# Patient Record
Sex: Female | Born: 1985
Health system: Southern US, Community
[De-identification: ages and names within clinical notes are randomized; demographics above are authoritative.]

## PROBLEM LIST (undated history)

## (undated) DIAGNOSIS — Z30431 Encounter for routine checking of intrauterine contraceptive device: Principal | ICD-10-CM

## (undated) DIAGNOSIS — Z975 Presence of (intrauterine) contraceptive device: Secondary | ICD-10-CM

## (undated) DIAGNOSIS — I10 Essential (primary) hypertension: Secondary | ICD-10-CM

## (undated) HISTORY — DX: Encounter for routine checking of intrauterine contraceptive device: Z30.431

## (undated) HISTORY — DX: Essential (primary) hypertension: I10

## (undated) HISTORY — DX: Presence of (intrauterine) contraceptive device: Z97.5

## (undated) HISTORY — PX: ABSCESS DRAINAGE: SHX1119

---

## 2005-03-27 ENCOUNTER — Emergency Department (HOSPITAL_COMMUNITY): Admission: EM | Admit: 2005-03-27 | Discharge: 2005-03-27 | Payer: Self-pay | Admitting: Emergency Medicine

## 2010-03-26 ENCOUNTER — Emergency Department (HOSPITAL_COMMUNITY): Admission: EM | Admit: 2010-03-26 | Discharge: 2010-03-26 | Payer: Self-pay | Admitting: Emergency Medicine

## 2013-08-19 ENCOUNTER — Emergency Department (HOSPITAL_COMMUNITY)
Admission: EM | Admit: 2013-08-19 | Discharge: 2013-08-20 | Disposition: A | Discharge status: Home / Self Care | Payer: 59 | Attending: Emergency Medicine | Admitting: Emergency Medicine

## 2013-08-19 ENCOUNTER — Encounter (HOSPITAL_COMMUNITY): Payer: Self-pay | Admitting: Emergency Medicine

## 2013-08-19 DIAGNOSIS — R599 Enlarged lymph nodes, unspecified: Secondary | ICD-10-CM | POA: Insufficient documentation

## 2013-08-19 DIAGNOSIS — J029 Acute pharyngitis, unspecified: Secondary | ICD-10-CM | POA: Insufficient documentation

## 2013-08-19 DIAGNOSIS — R51 Headache: Secondary | ICD-10-CM | POA: Insufficient documentation

## 2013-08-19 DIAGNOSIS — F17200 Nicotine dependence, unspecified, uncomplicated: Secondary | ICD-10-CM | POA: Insufficient documentation

## 2013-08-19 LAB — RAPID STREP SCREEN (MED CTR MEBANE ONLY): Streptococcus, Group A Screen (Direct): NEGATIVE

## 2013-08-19 MED ORDER — PENICILLIN G BENZATHINE 1200000 UNIT/2ML IM SUSP
1.2000 10*6.[IU] | Freq: Once | INTRAMUSCULAR | Status: AC
  Administered 2013-08-20: 1.2 10*6.[IU] via INTRAMUSCULAR
  Filled 2013-08-19: qty 2

## 2013-08-19 NOTE — ED Notes (Signed)
Sore throat and fever for the past couple of days. Took tylenol without relief.

## 2013-08-19 NOTE — ED Provider Notes (Signed)
CSN: [id number]     Arrival date & time 08/19/13  2157 History  This chart was scribed for Sunnie NielsenBrian Dartagnan Beavers, MD by Ardelia Memsylan Malpass, ED Scribe. This patient was seen in room APA19/APA19 and the patient's care was started at 11:45 PM.   Chief Complaint  Patient presents with  . Sore Throat    Patient is a 28 y.o. female presenting with pharyngitis. The history is provided by the patient. No language interpreter was used.  Sore Throat This is a new problem. The current episode started yesterday. The problem occurs rarely. The problem has been gradually worsening. Associated symptoms include headaches. Nothing aggravates the symptoms. Nothing relieves the symptoms. She has tried nothing for the symptoms.    HPI Comments: Jamie Aguilar is a 28 y.o. Female with no chronic medical conditions who presents to the Emergency Department complaining of a sore throat over the past 2 days. She reports associated headache, fever (Tmax 101.8 F) and an enlarged lymph node on the left side of her neck over the past 2 days. She states that she has taken Tylenol without relief of her symptoms. She states that she has a history of Strep throat about 15 years ago, and that her current symptoms feel similar. She states that she has had multiple sick contacts recently, due to working at [employer]. She denies emesis or any other symptoms.   History reviewed. No pertinent past medical history. History reviewed. No pertinent past surgical history. No family history on file. History  Substance Use Topics  . Smoking status: Current Some Day Smoker  . Smokeless tobacco: Not on file  . Alcohol Use: No   OB History   Grav Para Term Preterm Abortions TAB SAB Ect Mult Living                 Review of Systems  Constitutional: Positive for fever.  HENT: Positive for sore throat.   Gastrointestinal: Negative for vomiting.  Neurological: Positive for headaches.  All other systems reviewed and are negative.   Allergies   Review of patient's allergies indicates no known allergies.  Home Medications   Prior to Admission medications   Not on File   Triage Vitals: BP 145/94  Pulse 88  Temp(Src) 99.1 F (37.3 C) (Oral)  Resp 20  Ht 5\' 4"  (1.626 m)  Wt 340 lb (154.223 kg)  BMI 58.33 kg/m2  SpO2 100%  LMP 07/22/2013  Physical Exam  Nursing note and vitals reviewed. Constitutional: She is oriented to person, place, and time. She appears well-developed and well-nourished. No distress.  HENT:  Head: Normocephalic and atraumatic.  Mouth/Throat: Oropharyngeal exudate present.  Enlarged tonsils with exudates  Eyes: EOM are normal.  Neck: Neck supple. No tracheal deviation present.  Tender anterior cervical lymphadenopathy  Cardiovascular: Normal rate.   Pulmonary/Chest: Effort normal. No respiratory distress.  Musculoskeletal: Normal range of motion.  Lymphadenopathy:    She has cervical adenopathy.  Neurological: She is alert and oriented to person, place, and time.  Skin: Skin is warm and dry.  Psychiatric: She has a normal mood and affect. Her behavior is normal.    ED Course  Procedures (including critical care time)  DIAGNOSTIC STUDIES: Oxygen Saturation is 100% on RA, normal by my interpretation.    COORDINATION OF CARE: 11:53 PM- Discussed plan to obtain a Strep screen. Pt advised of plan for treatment and pt agrees.  Labs Review Labs Reviewed  RAPID STREP SCREEN  CULTURE, GROUP A STREP   Motrin provided.  Plan d/c home, WN provided as needed. Return precautions provided and verbalized as understood. Rx motrin.   MDM   Final diagnoses:  Pharyngitis   Bacterial versus viral - has exudates, tender lymph nodes, no cough, clinical strep and given PCN injection.  RN had sent a strep swab with prelim negative and Cx pending. VS and nurses notes reviewed.    I personally performed the services described in this documentation, which was scribed in my presence. The recorded  information has been reviewed and is accurate.    Sunnie NielsenBrian Jaelah Hauth, MD 08/20/13 [phone]

## 2013-08-20 MED ORDER — PENICILLIN G BENZATHINE 1200000 UNIT/2ML IM SUSP
INTRAMUSCULAR | Status: AC
  Filled 2013-08-20: qty 2

## 2013-08-20 MED ORDER — IBUPROFEN 800 MG PO TABS: 800.0000 mg | ORAL_TABLET | Freq: Three times a day (TID) | ORAL | Status: DC

## 2013-08-20 NOTE — Discharge Instructions (Signed)
Pharyngitis °Pharyngitis is redness, pain, and swelling (inflammation) of your pharynx.  °CAUSES  °Pharyngitis is usually caused by infection. Most of the time, these infections are from viruses (viral) and are part of a cold. However, sometimes pharyngitis is caused by bacteria (bacterial). Pharyngitis can also be caused by allergies. Viral pharyngitis may be spread from person to person by coughing, sneezing, and personal items or utensils (cups, forks, spoons, toothbrushes). Bacterial pharyngitis may be spread from person to person by more intimate contact, such as kissing.  °SIGNS AND SYMPTOMS  °Symptoms of pharyngitis include:   °· Sore throat.   °· Tiredness (fatigue).   °· Low-grade fever.   °· Headache. °· Joint pain and muscle aches. °· Skin rashes. °· Swollen lymph nodes. °· Plaque-like film on throat or tonsils (often seen with bacterial pharyngitis). °DIAGNOSIS  °Your health care provider will ask you questions about your illness and your symptoms. Your medical history, along with a physical exam, is often all that is needed to diagnose pharyngitis. Sometimes, a rapid strep test is done. Other lab tests may also be done, depending on the suspected cause.  °TREATMENT  °Viral pharyngitis will usually get better in 3 4 days without the use of medicine. Bacterial pharyngitis is treated with medicines that kill germs (antibiotics).  °HOME CARE INSTRUCTIONS  °· Drink enough water and fluids to keep your urine clear or pale yellow.   °· Only take over-the-counter or prescription medicines as directed by your health care provider:   °· If you are prescribed antibiotics, make sure you finish them even if you start to feel better.   °· Do not take aspirin.   °· Get lots of rest.   °· Gargle with 8 oz of salt water (½ tsp of salt per 1 qt of water) as often as every 1 2 hours to soothe your throat.   °· Throat lozenges (if you are not at risk for choking) or sprays may be used to soothe your throat. °SEEK MEDICAL  CARE IF:  °· You have large, tender lumps in your neck. °· You have a rash. °· You cough up green, yellow-brown, or bloody spit. °SEEK IMMEDIATE MEDICAL CARE IF:  °· Your neck becomes stiff. °· You drool or are unable to swallow liquids. °· You vomit or are unable to keep medicines or liquids down. °· You have severe pain that does not go away with the use of recommended medicines. °· You have trouble breathing (not caused by a stuffy nose). °MAKE SURE YOU:  °· Understand these instructions. °· Will watch your condition. °· Will get help right away if you are not doing well or get worse. °Document Released: 04/21/2005 Document Revised: 02/09/2013 Document Reviewed: 12/27/2012 °ExitCare® Patient Information ©2014 [employer]. ° °

## 2013-08-21 ENCOUNTER — Encounter (HOSPITAL_COMMUNITY): Payer: Self-pay | Admitting: Emergency Medicine

## 2013-08-21 ENCOUNTER — Emergency Department (HOSPITAL_COMMUNITY)
Admission: EM | Admit: 2013-08-21 | Discharge: 2013-08-21 | Disposition: A | Discharge status: Home / Self Care | Payer: 59 | Attending: Emergency Medicine | Admitting: Emergency Medicine

## 2013-08-21 DIAGNOSIS — H9209 Otalgia, unspecified ear: Secondary | ICD-10-CM | POA: Insufficient documentation

## 2013-08-21 DIAGNOSIS — J029 Acute pharyngitis, unspecified: Secondary | ICD-10-CM | POA: Insufficient documentation

## 2013-08-21 DIAGNOSIS — F17200 Nicotine dependence, unspecified, uncomplicated: Secondary | ICD-10-CM | POA: Insufficient documentation

## 2013-08-21 DIAGNOSIS — Z791 Long term (current) use of non-steroidal anti-inflammatories (NSAID): Secondary | ICD-10-CM | POA: Insufficient documentation

## 2013-08-21 LAB — MONONUCLEOSIS SCREEN: MONO SCREEN: NEGATIVE

## 2013-08-21 MED ORDER — IBUPROFEN 100 MG/5ML PO SUSP
600.0000 mg | Freq: Once | ORAL | Status: AC
  Administered 2013-08-21: 600 mg via ORAL
  Filled 2013-08-21: qty 30

## 2013-08-21 MED ORDER — LIDOCAINE VISCOUS 2 % MT SOLN
15.0000 mL | Freq: Once | OROMUCOSAL | Status: AC
  Administered 2013-08-21: 15 mL via OROMUCOSAL
  Filled 2013-08-21: qty 15

## 2013-08-21 MED ORDER — MAGIC MOUTHWASH W/LIDOCAINE
10.0000 mL | Freq: Four times a day (QID) | ORAL | Status: DC | PRN
Start: 2013-08-21 — End: 2013-09-02

## 2013-08-21 NOTE — ED Provider Notes (Signed)
Medical screening examination/treatment/procedure(s) were performed by non-physician practitioner and as supervising physician I was immediately available for consultation/collaboration.  Flint MelterElliott L Vernestine Brodhead, MD 08/21/13 [phone]
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
## 2013-08-21 NOTE — Discharge Instructions (Signed)
Pharyngitis Pharyngitis is redness, pain, and swelling (inflammation) of your pharynx.  CAUSES  Pharyngitis is usually caused by infection. Most of the time, these infections are from viruses (viral) and are part of a cold. However, sometimes pharyngitis is caused by bacteria (bacterial). Pharyngitis can also be caused by allergies. Viral pharyngitis may be spread from person to person by coughing, sneezing, and personal items or utensils (cups, forks, spoons, toothbrushes). Bacterial pharyngitis may be spread from person to person by more intimate contact, such as kissing.  SIGNS AND SYMPTOMS  Symptoms of pharyngitis include:   Sore throat.   Tiredness (fatigue).   Low-grade fever.   Headache.  Joint pain and muscle aches.  Skin rashes.  Swollen lymph nodes.  Plaque-like film on throat or tonsils (often seen with bacterial pharyngitis). DIAGNOSIS  Your health care provider will ask you questions about your illness and your symptoms. Your medical history, along with a physical exam, is often all that is needed to diagnose pharyngitis. Sometimes, a rapid strep test is done. Other lab tests may also be done, depending on the suspected cause.  TREATMENT  Viral pharyngitis will usually get better in 3 4 days without the use of medicine. Bacterial pharyngitis is treated with medicines that kill germs (antibiotics).  HOME CARE INSTRUCTIONS   Drink enough water and fluids to keep your urine clear or pale yellow.   Only take over-the-counter or prescription medicines as directed by your health care provider:   If you are prescribed antibiotics, make sure you finish them even if you start to feel better.   Do not take aspirin.   Get lots of rest.   Gargle with 8 oz of salt water ( tsp of salt per 1 qt of water) as often as every 1 2 hours to soothe your throat.   Throat lozenges (if you are not at risk for choking) or sprays may be used to soothe your throat. SEEK MEDICAL  CARE IF:   You have large, tender lumps in your neck.  You have a rash.  You cough up green, yellow-brown, or bloody spit. SEEK IMMEDIATE MEDICAL CARE IF:   Your neck becomes stiff.  You drool or are unable to swallow liquids.  You vomit or are unable to keep medicines or liquids down.  You have severe pain that does not go away with the use of recommended medicines.  You have trouble breathing (not caused by a stuffy nose). MAKE SURE YOU:   Understand these instructions.  Will watch your condition.  Will get help right away if you are not doing well or get worse. Document Released: 04/21/2005 Document Revised: 02/09/2013 Document Reviewed: 12/27/2012 [facility] Patient Information 2014 [place], [place].   Your lab test for mono is negative today.  Your strep culture is pending (from your visit on Friday).  Rest,  Use ibuprofen for throat pain relief and fever,  Magic mouthwash can also help with topical pain relief.  Rest,  Make sure you are drinking plenty of fluids.

## 2013-08-21 NOTE — ED Notes (Addendum)
Pt co sore throat with swelling and white patches since Thursday. Pt states she cannot swallow without pain. Pt treated for strep throat on Fri, given penicillin injection, condition worse since then.

## 2013-08-21 NOTE — ED Notes (Signed)
Pt alert & oriented x4, stable gait. Patient given discharge instructions, paperwork & prescription(s). Patient  instructed to stop at the registration desk to finish any additional paperwork. Patient verbalized understanding. Pt left department w/ no further questions. 

## 2013-08-22 LAB — CULTURE, GROUP A STREP

## 2013-08-24 ENCOUNTER — Emergency Department (HOSPITAL_COMMUNITY)
Admission: EM | Admit: 2013-08-24 | Discharge: 2013-08-24 | Disposition: A | Discharge status: Home / Self Care | Payer: 59 | Source: Home / Self Care | Attending: Emergency Medicine | Admitting: Emergency Medicine

## 2013-08-24 ENCOUNTER — Encounter (HOSPITAL_COMMUNITY): Payer: Self-pay | Admitting: Emergency Medicine

## 2013-08-24 DIAGNOSIS — J039 Acute tonsillitis, unspecified: Secondary | ICD-10-CM

## 2013-08-24 LAB — POCT I-STAT, CHEM 8
BUN: 11 mg/dL (ref 6–23)
CHLORIDE: 105 meq/L (ref 96–112)
CREATININE: 0.8 mg/dL (ref 0.50–1.10)
Calcium, Ion: 1.18 mmol/L (ref 1.12–1.23)
Glucose, Bld: 111 mg/dL — ABNORMAL HIGH (ref 70–99)
HCT: 47 % — ABNORMAL HIGH (ref 36.0–46.0)
Hemoglobin: 16 g/dL — ABNORMAL HIGH (ref 12.0–15.0)
Potassium: 4.1 mEq/L (ref 3.7–5.3)
SODIUM: 143 meq/L (ref 137–147)
TCO2: 23 mmol/L (ref 0–100)

## 2013-08-24 MED ORDER — ONDANSETRON 4 MG PO TBDP
ORAL_TABLET | ORAL | Status: AC
  Filled 2013-08-24: qty 2

## 2013-08-24 MED ORDER — ONDANSETRON 8 MG PO TBDP: 8.0000 mg | ORAL_TABLET | Freq: Three times a day (TID) | ORAL | Status: DC | PRN

## 2013-08-24 MED ORDER — DEXAMETHASONE SODIUM PHOSPHATE 10 MG/ML IJ SOLN
INTRAMUSCULAR | Status: AC
  Filled 2013-08-24: qty 1

## 2013-08-24 MED ORDER — PREDNISONE 20 MG PO TABS: 20.0000 mg | ORAL_TABLET | Freq: Two times a day (BID) | ORAL | Status: DC

## 2013-08-24 MED ORDER — CLINDAMYCIN HCL 300 MG PO CAPS: 300.0000 mg | ORAL_CAPSULE | Freq: Four times a day (QID) | ORAL | Status: DC

## 2013-08-24 MED ORDER — ONDANSETRON 4 MG PO TBDP
8.0000 mg | ORAL_TABLET | Freq: Once | ORAL | Status: AC
  Administered 2013-08-24: 8 mg via ORAL

## 2013-08-24 MED ORDER — OXYCODONE-ACETAMINOPHEN 5-325 MG PO TABS: ORAL_TABLET | ORAL | Status: DC

## 2013-08-24 MED ORDER — DEXAMETHASONE SODIUM PHOSPHATE 10 MG/ML IJ SOLN
10.0000 mg | Freq: Once | INTRAMUSCULAR | Status: AC
  Administered 2013-08-24: 10 mg via INTRAMUSCULAR
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
## 2013-08-24 NOTE — Discharge Instructions (Signed)

## 2013-08-24 NOTE — ED Notes (Signed)
Recently dx with strep on Friday.  Pt presents to day with vomiting since waking this a.m.    Also having fever,  Weakness,  And no appetites.  States 10lb weight loss.

## 2013-08-26 ENCOUNTER — Emergency Department (INDEPENDENT_AMBULATORY_CARE_PROVIDER_SITE_OTHER)
Admission: EM | Admit: 2013-08-26 | Discharge: 2013-08-26 | Disposition: A | Discharge status: Home / Self Care | Payer: 59 | Source: Home / Self Care | Attending: Family Medicine | Admitting: Family Medicine

## 2013-08-26 ENCOUNTER — Encounter (HOSPITAL_COMMUNITY): Payer: Self-pay | Admitting: Emergency Medicine

## 2013-08-26 DIAGNOSIS — J039 Acute tonsillitis, unspecified: Secondary | ICD-10-CM

## 2013-08-26 MED ORDER — OXYCODONE-ACETAMINOPHEN 5-325 MG PO TABS: ORAL_TABLET | ORAL | Status: DC

## 2013-08-26 MED ORDER — CEFTRIAXONE SODIUM 1 G IJ SOLR
INTRAMUSCULAR | Status: AC
  Filled 2013-08-26: qty 10

## 2013-08-26 MED ORDER — CEFTRIAXONE SODIUM 250 MG IJ SOLR
1000.0000 mg | Freq: Once | INTRAMUSCULAR | Status: AC
  Administered 2013-08-26: 1000 mg via INTRAMUSCULAR

## 2013-08-26 MED ORDER — LIDOCAINE HCL (PF) 1 % IJ SOLN
INTRAMUSCULAR | Status: AC
  Filled 2013-08-26: qty 5

## 2013-08-26 NOTE — Discharge Instructions (Signed)
Thank you for coming in today. [facility]  Tonsillitis Tonsillitis is an infection of the throat that causes the tonsils to become red, tender, and swollen. Tonsils are collections of lymphoid tissue at the back of the throat. Each tonsil has crevices (crypts). Tonsils help fight nose and throat infections and keep infection from spreading to other parts of the body for the first 18 months of life.  CAUSES Sudden (acute) tonsillitis is usually caused by infection with streptococcal bacteria. Long-lasting (chronic) tonsillitis occurs when the crypts of the tonsils become filled with pieces of food and bacteria, which makes it easy for the tonsils to become repeatedly infected. SYMPTOMS  Symptoms of tonsillitis include:  A sore throat, with possible difficulty swallowing.  White patches on the tonsils.  Fever.  Tiredness.  New episodes of snoring during sleep, when you did not snore before.  Small, foul-smelling, yellowish-white pieces of material (tonsilloliths) that you occasionally cough up or spit out. The tonsilloliths can also cause you to have bad breath. DIAGNOSIS Tonsillitis can be diagnosed through a physical exam. Diagnosis can be confirmed with the results of lab tests, including a throat culture. TREATMENT  The goals of tonsillitis treatment include the reduction of the severity and duration of symptoms and prevention of associated conditions. Symptoms of tonsillitis can be improved with the use of steroids to reduce the swelling. Tonsillitis caused by bacteria can be treated with antibiotics. Usually, treatment with antibiotics is started before the cause of the tonsillitis is known. However, if it is determined that the cause is not bacterial, antibiotics will not treat the tonsillitis. If attacks of tonsillitis are severe and frequent, your caregiver may recommend surgery to remove the tonsils (tonsillectomy). HOME CARE INSTRUCTIONS     Rest as much as possible and get plenty of sleep.  Drink plenty of fluids. While the throat is very sore, eat soft foods or liquids, such as sherbet, soups, or instant breakfast drinks.  Eat frozen ice pops.  Gargle with a warm or cold liquid to help soothe the throat. Mix 1/4 teaspoon of salt and 1/4 teaspoon of baking soda in in 8 oz of water. SEEK MEDICAL CARE IF:   Large, tender lumps develop in your neck.  A rash develops.  A green, yellow-brown, or bloody substance is coughed up.  You are unable to swallow liquids or food for 24 hours.  You notice that only one of the tonsils is swollen. SEEK IMMEDIATE MEDICAL CARE IF:   You develop any new symptoms such as vomiting, severe headache, stiff neck, chest pain, or trouble breathing or swallowing.  You have severe throat pain along with drooling or voice changes.  You have severe pain, unrelieved with recommended medications.  You are unable to fully open the mouth.  You develop redness, swelling, or severe pain anywhere in the neck.  You have a fever. MAKE SURE YOU:   Understand these instructions.  Will watch your condition.  Will get help right away if you are not doing well or get worse. Document Released: 01/29/2005 Document Revised: 12/22/2012 Document Reviewed: 10/08/2012 [facility] Patient Information 2014 [place]Care, [place]LL[place].

## 2013-08-26 NOTE — ED Provider Notes (Signed)
Jamie Aguilar is a 28 y.o. female who presents to Urgent Care today for tonsil pain. Patient was seen 2 days prior for tonsillitis. She failed treatment with penicillin. She was started on prednisone, clindamycin, and oxycodone. She has moderate improvement in pain. She still has pain with swallowing. She denies any fevers chills nausea vomiting or diarrhea. She is here today for reevaluation.   History reviewed. No pertinent past medical history. History  Substance Use Topics  . Smoking status: Current Some Day Smoker  . Smokeless tobacco: Not on file  . Alcohol Use: Yes   ROS as above Medications: Current Facility-Administered Medications  Medication Dose Route Frequency Provider Last Rate Last Dose  . cefTRIAXone (ROCEPHIN) injection 1,000 mg  1,000 mg Intramuscular Once Rodolph BongEvan S Ridley Schewe, MD       Current Outpatient Prescriptions  Medication Sig Dispense Refill  . Alum & Mag Hydroxide-Simeth (MAGIC MOUTHWASH W/LIDOCAINE) SOLN Take 10 mLs by mouth 4 (four) times daily as needed (throat pain).  120 mL  0  . clindamycin (CLEOCIN) 300 MG capsule Take 1 capsule (300 mg total) by mouth 4 (four) times daily.  40 capsule  0  . ibuprofen (ADVIL,MOTRIN) 800 MG tablet Take 1 tablet (800 mg total) by mouth 3 (three) times daily.  21 tablet  0  . ondansetron (ZOFRAN ODT) 8 MG disintegrating tablet Take 1 tablet (8 mg total) by mouth every 8 (eight) hours as needed for nausea.  20 tablet  0  . oxyCODONE-acetaminophen (PERCOCET) 5-325 MG per tablet 1 to 2 tablets every 6 hours as needed for pain.  20 tablet  0  . predniSONE (DELTASONE) 20 MG tablet Take 1 tablet (20 mg total) by mouth 2 (two) times daily.  10 tablet  0    Exam:  BP 146/102  Pulse 95  Temp(Src) 98 F (36.7 C) (Oral)  Resp 18  Ht 5\' 4"  (1.626 m)  Wt 333 lb (151.048 kg)  BMI 57.13 kg/m2  SpO2 98%  LMP 07/24/2013 Gen: Well NAD HEENT: EOMI,  MMM significant tonsillar erythema and exudate bilaterally. The left tonsil is somewhat  enlarged. No midline deviation. Lungs: Normal work of breathing. CTABL Heart: RRR no MRG Abd: NABS, Soft. NT, ND Exts: Brisk capillary refill, warm and well perfused.   No results found for this or any previous visit (from the past 24 hour(s)). No results found.  Assessment and Plan: 28 y.o. female with tonsillitis. Doubtful for peritonsillar abscess. Plan to administer ceftriaxone injection. Continue clindamycin, prednisone, and oxycodone. [facility]  Discussed warning signs or symptoms. Please see discharge instructions. Patient expresses understanding.   Rodolph BongEvan S Joud Pettinato, MD 08/26/13 1154

## 2013-09-02 ENCOUNTER — Emergency Department (HOSPITAL_COMMUNITY): Payer: 59

## 2013-09-02 ENCOUNTER — Encounter (HOSPITAL_COMMUNITY): Payer: Self-pay | Admitting: Emergency Medicine

## 2013-09-02 ENCOUNTER — Emergency Department (HOSPITAL_COMMUNITY)
Admission: EM | Admit: 2013-09-02 | Discharge: 2013-09-02 | Disposition: A | Discharge status: Home / Self Care | Payer: 59 | Attending: Emergency Medicine | Admitting: Emergency Medicine

## 2013-09-02 DIAGNOSIS — F17200 Nicotine dependence, unspecified, uncomplicated: Secondary | ICD-10-CM | POA: Insufficient documentation

## 2013-09-02 DIAGNOSIS — R5381 Other malaise: Secondary | ICD-10-CM | POA: Insufficient documentation

## 2013-09-02 DIAGNOSIS — Z792 Long term (current) use of antibiotics: Secondary | ICD-10-CM | POA: Insufficient documentation

## 2013-09-02 DIAGNOSIS — R112 Nausea with vomiting, unspecified: Secondary | ICD-10-CM | POA: Insufficient documentation

## 2013-09-02 DIAGNOSIS — Z8619 Personal history of other infectious and parasitic diseases: Secondary | ICD-10-CM | POA: Insufficient documentation

## 2013-09-02 DIAGNOSIS — Z79899 Other long term (current) drug therapy: Secondary | ICD-10-CM | POA: Insufficient documentation

## 2013-09-02 DIAGNOSIS — IMO0002 Reserved for concepts with insufficient information to code with codable children: Secondary | ICD-10-CM | POA: Insufficient documentation

## 2013-09-02 DIAGNOSIS — R1013 Epigastric pain: Secondary | ICD-10-CM | POA: Insufficient documentation

## 2013-09-02 DIAGNOSIS — R1032 Left lower quadrant pain: Secondary | ICD-10-CM | POA: Insufficient documentation

## 2013-09-02 DIAGNOSIS — Z3202 Encounter for pregnancy test, result negative: Secondary | ICD-10-CM | POA: Insufficient documentation

## 2013-09-02 DIAGNOSIS — R109 Unspecified abdominal pain: Secondary | ICD-10-CM

## 2013-09-02 DIAGNOSIS — R5383 Other fatigue: Secondary | ICD-10-CM

## 2013-09-02 LAB — CBC WITH DIFFERENTIAL/PLATELET
Basophils Absolute: 0 10*3/uL (ref 0.0–0.1)
Basophils Relative: 0 % (ref 0–1)
Eosinophils Absolute: 0.1 10*3/uL (ref 0.0–0.7)
Eosinophils Relative: 1 % (ref 0–5)
HCT: 38.9 % (ref 36.0–46.0)
HEMOGLOBIN: 13 g/dL (ref 12.0–15.0)
LYMPHS ABS: 2 10*3/uL (ref 0.7–4.0)
Lymphocytes Relative: 23 % (ref 12–46)
MCH: 30 pg (ref 26.0–34.0)
MCHC: 33.4 g/dL (ref 30.0–36.0)
MCV: 89.6 fL (ref 78.0–100.0)
MONOS PCT: 8 % (ref 3–12)
Monocytes Absolute: 0.7 10*3/uL (ref 0.1–1.0)
NEUTROS ABS: 6 10*3/uL (ref 1.7–7.7)
Neutrophils Relative %: 68 % (ref 43–77)
Platelets: 297 10*3/uL (ref 150–400)
RBC: 4.34 MIL/uL (ref 3.87–5.11)
RDW: 12.6 % (ref 11.5–15.5)
WBC: 8.7 10*3/uL (ref 4.0–10.5)

## 2013-09-02 LAB — URINALYSIS, ROUTINE W REFLEX MICROSCOPIC
GLUCOSE, UA: NEGATIVE mg/dL
Ketones, ur: 15 mg/dL — AB
LEUKOCYTES UA: NEGATIVE
Nitrite: NEGATIVE
Specific Gravity, Urine: 1.015 (ref 1.005–1.030)
Urobilinogen, UA: 0.2 mg/dL (ref 0.0–1.0)
pH: 8 (ref 5.0–8.0)

## 2013-09-02 LAB — COMPREHENSIVE METABOLIC PANEL
ALBUMIN: 3.7 g/dL (ref 3.5–5.2)
ALT: 24 U/L (ref 0–35)
AST: 22 U/L (ref 0–37)
Alkaline Phosphatase: 82 U/L (ref 39–117)
BILIRUBIN TOTAL: 0.6 mg/dL (ref 0.3–1.2)
BUN: 12 mg/dL (ref 6–23)
CHLORIDE: 104 meq/L (ref 96–112)
CO2: 24 mEq/L (ref 19–32)
Calcium: 9.1 mg/dL (ref 8.4–10.5)
Creatinine, Ser: 0.87 mg/dL (ref 0.50–1.10)
GFR calc non Af Amer: 90 mL/min — ABNORMAL LOW (ref >90)
GLUCOSE: 107 mg/dL — AB (ref 70–99)
POTASSIUM: 3.8 meq/L (ref 3.7–5.3)
Sodium: 141 mEq/L (ref 137–147)
Total Protein: 7.6 g/dL (ref 6.0–8.3)

## 2013-09-02 LAB — LIPASE, BLOOD: LIPASE: 18 U/L (ref 11–59)

## 2013-09-02 LAB — PREGNANCY, URINE: PREG TEST UR: NEGATIVE

## 2013-09-02 LAB — URINE MICROSCOPIC-ADD ON

## 2013-09-02 MED ORDER — GI COCKTAIL ~~LOC~~
30.0000 mL | Freq: Once | ORAL | Status: AC
  Administered 2013-09-02: 30 mL via ORAL
  Filled 2013-09-02: qty 30

## 2013-09-02 MED ORDER — SODIUM CHLORIDE 0.9 % IV SOLN
INTRAVENOUS | Status: DC
  Administered 2013-09-02: 15:00:00 via INTRAVENOUS

## 2013-09-02 MED ORDER — ONDANSETRON HCL 4 MG PO TABS: 4.0000 mg | ORAL_TABLET | Freq: Three times a day (TID) | ORAL | Status: DC | PRN

## 2013-09-02 MED ORDER — SODIUM CHLORIDE 0.9 % IV BOLUS (SEPSIS)
500.0000 mL | Freq: Once | INTRAVENOUS | Status: AC
  Administered 2013-09-02: 500 mL via INTRAVENOUS

## 2013-09-02 MED ORDER — ONDANSETRON HCL 4 MG/2ML IJ SOLN
INTRAMUSCULAR | Status: AC
  Filled 2013-09-02: qty 2

## 2013-09-02 MED ORDER — FAMOTIDINE IN NACL 20-0.9 MG/50ML-% IV SOLN
20.0000 mg | Freq: Once | INTRAVENOUS | Status: AC
  Administered 2013-09-02: 20 mg via INTRAVENOUS
  Filled 2013-09-02: qty 50

## 2013-09-02 MED ORDER — ONDANSETRON HCL 4 MG/2ML IJ SOLN
4.0000 mg | INTRAMUSCULAR | Status: AC | PRN
  Administered 2013-09-02 (×2): 4 mg via INTRAVENOUS
  Filled 2013-09-02: qty 2

## 2013-09-02 NOTE — Discharge Instructions (Signed)
°Emergency Department Resource Guide °1) Find a Doctor and Pay Out of Pocket °Although you won't have to find out who is covered by your insurance plan, it is a good idea to ask around and get recommendations. You will then need to call the office and see if the doctor you have chosen will accept you as a new patient and what types of options they offer for patients who are self-pay. Some doctors offer discounts or will set up payment plans for their patients who do not have insurance, but you will need to ask so you aren't surprised when you get to your appointment. ° °2) Contact Your Local Health Department °Not all health departments have doctors that can see patients for sick visits, but many do, so it is worth a call to see if yours does. If you don't know where your local health department is, you can check in your phone book. The CDC also has a tool to help you locate your state's health department, and many state websites also have listings of all of their local health departments. ° °3) Find a Walk-in Clinic °If your illness is not likely to be very severe or complicated, you may want to try a walk in clinic. These are popping up all over the country in pharmacies, drugstores, and shopping centers. They're usually staffed by nurse practitioners or physician assistants that have been trained to treat common illnesses and complaints. They're usually fairly quick and inexpensive. However, if you have serious medical issues or chronic medical problems, these are probably not your best option. ° °No Primary Care Doctor: °- Call [facility] Connect at  832-8000 - they can help you locate a primary care doctor that  accepts your insurance, provides certain services, etc. °- Physician Referral Service- 1-800-533-3463 ° °Chronic Pain Problems: °Organization         Address  Phone   Notes  °[facility]  (336) [phone] Patients need to be referred by their primary care doctor.  ° °Medication  Assistance: °Organization         Address  Phone   Notes  °[place] [facility] [address] °[place], [place] [address] (336) 641-8030 --Must be a resident of [place] °-- Must have NO insurance coverage whatsoever (no Medicaid/ Medicare, etc.) °-- The pt. MUST have a primary care doctor that directs their care regularly and follows them in the community °  °[facility]  (866) 331-1348   °[employer]  (888) 892-1162   ° °Agencies that provide inexpensive medical care: °Organization         Address  Phone   Notes  °[facility]  (336) 832-8035   °[facility]    (336) 832-7272   °[facility] [address] °[place], [place] [address] (336) 832-4777   °[facility] [address], °[place] (336) 271-4999   °[facility]    (336) 373-0678   °[facility]    (336) 272-1050   °[facility] ° [address], [place] Phone:  (336) 832-4444, Fax:  (336) 832-4440 Hours of Operation:  9 am - 6 pm, M-F.  Also accepts Medicaid/Medicare and self-pay.  °[facility] ° [address], [place] Phone: (336) [phone], Fax: (336) [phone]. Hours of Operation:  8:30 am - 5:30 pm, M-F.  Also accepts Medicaid and self-pay.  °[facility] [place] [address], [place] Phone: (336) 878-6027   °[facility] [address], [place], [place] (336)723-1848, Ext. 123 Mondays & Thursdays: 7-9 AM.  First 15 patients are seen on a first come, first serve basis. °  ° °Medicaid-accepting [place] Providers: ° °Organization         Address  Phone   Notes  °[facility] [address], [place] (336) 641-2100 Also accepts self-pay patients.  °[facility] [address], [place] ° (336) 856-9996   °[facility] [address], [place]  (336) 288-8857   °[facility] [address] [place] [address], [place] (336) 299-7000   °Veita Bland [address], [place]  ° (336) 373-1557 Only accepts [place] Access Medicaid patients after they have their name applied to their card.  ° °Self-Pay (no insurance) in [place]: ° °Organization         Address  Phone   Notes  °Sickle Cell Patients, [facility] [address], [place] (336) 832-1970   °[facility] [address], [place] (336) 832-4400   °[facility] ° [address], [place] (336) 992-4800   °[facility]/Dr. Osei-Bonsu ° [address] [place] [address], [place] or [address], [place] (336) 841-8500 Phone number for both [place] and [place] locations is the same.  °[facility] [address], [place] (336) 299-0000   °[facility] [address] [place] [address], [place] or [address] (336) 852-7530 °(336) 878-2260   °[facility] [address], [place] (336) 350-1642, phone; (336) 294-5005, fax Sees patients 1st and 3rd Saturday of every month.  Must not qualify for public or private insurance (i.e. Medicaid, Medicare, [place] Health Choice, Veterans' Benefits) • Household income should be no more than 200% of the poverty level •The clinic cannot treat you if you are pregnant or think you are pregnant • Sexually transmitted diseases are not treated at the clinic.  ° ° °Dental Care: °Organization         Address  Phone  Notes  °[place] [facility] [address], [place] (336) 641-6152 Accepts children up to age 21 who are enrolled in Medicaid or [place] Health Choice; pregnant women with a Medicaid card; and children who have applied for Medicaid or [place] Health Choice, but were declined, whose parents can pay a reduced fee at time of service.  °[place]  [facility] [place]  [address], [place] (336) 641-7733 Accepts children up to age 21 who are enrolled in Medicaid or [place] Health Choice; pregnant women with a Medicaid card; and children who have applied for Medicaid or [place] Health Choice, but were declined, whose parents can pay a reduced fee at time of service.  °[facility] ° [address], [place] (336) 641-4533 Patients are seen by appointment only. Walk-ins are not accepted. [facility] will see patients 18 years of age and older. °Monday - Tuesday (8am-5pm) °Most Wednesdays (8:30-5pm) °$30 per visit, cash only  °[facility] ° [address], [place] (336) 641-4533 Patients are seen by appointment only. Walk-ins are not accepted. [facility] will see patients 18 years of age and older. °One   Wednesday Evening (Monthly: Volunteer Based).  $30 per visit, cash only  °[facility]  (919) 537-3737 for adults; Children under age 4, call Graduate Pediatric Dentistry at ([phone]) [phone]. Children aged 4-14, please call ([phone]) [phone] to request a pediatric application. ° Dental services are provided in all areas of dental care including fillings, crowns and bridges, complete and partial dentures, implants, gum treatment, root canals, and extractions. Preventive care is also provided. Treatment is provided to both adults and children. °Patients are selected via a lottery and there is often a waiting list. °  °[facility] [address], °[place] ° (336) 763-8833 [email or web address] °  °[facility] [address], [place], [place] (336)723-1848, Ext. 123 Second and Fourth Thursday of each month, opens at 6:30 AM; Clinic ends at 9 AM.  Patients are seen on a first-come first-served basis, and a limited number are seen during each clinic.  ° °[facility] ° [address], [place], [place] (336) 723-7904    Eligibility Requirements °You must have lived in [place], [place], or [place] counties for at least the last three months. °  You cannot be eligible for state or federal sponsored healthcare insurance, including Veterans Administration, Medicaid, or Medicare. °  You generally cannot be eligible for healthcare insurance through your employer.  °  How to apply: °Eligibility screenings are held every Tuesday and Wednesday afternoon from 1:00 pm until 4:00 pm. You do not need an appointment for the interview!  °[facility] [address], [place], [place] 336-631-2330   °[facility]  336-342-8273   °[facility]  336-703-3100   °[facility]  336-570-6415   ° °Behavioral Health Resources in the Community: °Intensive Outpatient Programs °Organization         Address  Phone  Notes  °[facility] [address], [place], [place] 336-878-6098   °[facility] [address], [place], [place] 336-832-9800   °[facility] [address], [place], [place] ° 336-882-2125   °[facility] [address],  °[place], [place] 1-800-853-5163 or 336-641-4981   °Substance Abuse Resources °Organization         Address  Phone  Notes  °[facility]  336-882-2125   °[facility]  336-784-9470   °[facility]  336-285-9073   °[facility]  336-845-3988   °Residential & Outpatient Substance Abuse Program  1-800-659-3381   °Psychological Services °Organization         Address  Phone  Notes  °[facility]  336- 832-9600   °[facility]  336- 378-7881   °[facility] [address], [place] 1-800-853-5163 or 336-641-4981   ° °Mobile Crisis Teams °Organization         Address  Phone  Notes  °[facility], Mobile Crisis Care Unit  1-877-626-1772   °Assertive °[facility] ° [address]  [place], [place] 336-834-9664   °Sharon DeEsch [address] °[place] 336-554-5454   ° °Self-Help/Support Groups °Organization         Address  Phone             Notes  °[facility] of  - variety of support groups  336- 373-1402 Call for more information  °Narcotics Anonymous (NA), [facility] [address], °[place]  2 meetings at this location  ° °  Residential Treatment Programs Organization         Address  Phone  Notes  [facility] [address],    [place]  [phone]   [facility]  [address], [place] [phone], [place], [place] [phone]   [facility] [address], [place] [phone] Admissions: 8am-3pm M-F  [facility] [address]    [place], [place] [phone]   [facility] [address], [place], [place] [phone]   [facility] [address]  [place], [place] [phone](912) [phone]   [facility] Intensive Outpatient [address], [place], [place] [phone]   [facility] ([facility] [address]  [place], [place] [phone] or (351) 784-3500([phone]) [phone]   [facility] ([facility] [address] [place], [place] [phone] Accepts Medicaid  [facility] [address]  [place] [phone]) [phone] Substance Abuse/Addiction Treatment   [facility]         Address  Phone  Notes  [facility]  478-054-2282(888) (684) [phone]   Angie FavaJulie Brannon, PhD [address], [address] [place], [place]   (639)[phone](336) [phone] or [phone](336) [phone]   [facility]   [address] [place], [place] [phone](336) [phone]   [facility] [address], [place], [place] [phone](336) [phone] Insurance/Medicaid/sponsorship through [facility]  [facility] [address]                                    [place], [place] [phone](336) [phone] Therapy/tele-psych/case    [facility] [address]   [place], [place] [phone](336) [phone]    Dr. Lolly MustacheArfeen  [phone](336) [phone]   [facility]  [employer] [facility] 1) [address], [place] 2) [address], [place] 3)  [address], [place] [phone](336) [phone](336) [phone]  [phone](336) [phone]   [facility] Child Abuse Hotline (419)078-1716(336) 805-555-9215 or 412-132-7679(336) (438)[phone] (After Hours)       Eat a bland diet, avoiding greasy, fatty, fried foods, as well as spicy and acidic foods or beverages.  Avoid eating within the hour or 2 before going to bed or laying down.  Also avoid teas, colas, coffee, chocolate, pepermint and spearment.  Take over the counter pepcid, one tablet by mouth twice a day, for the next 2 to 3 weeks.  May also take over the counter maalox/mylanta, as directed on packaging, as needed for discomfort.  Take the prescription as directed.  Call your regular medical doctor and the GI doctor on Monday to schedule a follow up appointment within the next week.  Return to the Emergency Department immediately if worsening.

## 2013-09-02 NOTE — ED Provider Notes (Signed)
CSN: [id number]     Arrival date & time 09/02/13  1330 History   First MD Initiated Contact with Patient 09/02/13 1357     Chief Complaint  Patient presents with  . Emesis      HPI Pt was seen at 1400. Per pt, c/o gradual onset and persistence of multiple intermittent episodes of N/V that began 2 weeks ago. Has been associated with upper abd pain which radiates into her chest and back. States her symptoms began "after I got sick with a sore throat that was a virus" 2 weeks ago. Has been associated with generalized weakness/fatigue. Denies CP/SOB, no flank pain, no fevers, no black or blood in stools or emesis.     History reviewed. No pertinent past medical history.  Past Surgical History  Procedure Laterality Date  . Cesarean section      History  Substance Use Topics  . Smoking status: Current Some Day Smoker  . Smokeless tobacco: Not on file  . Alcohol Use: Yes    Review of Systems ROS: Statement: All systems negative except as marked or noted in the HPI; Constitutional: Negative for fever and chills. +generalized weakness/fatigue. ; ; Eyes: Negative for eye pain, redness and discharge. ; ; ENMT: Negative for ear pain, hoarseness, nasal congestion, sinus pressure and sore throat. ; ; Cardiovascular: Negative for palpitations, diaphoresis, dyspnea and peripheral edema. ; ; Respiratory: Negative for cough, wheezing and stridor. ; ; Gastrointestinal: +N/V, abd pain. Negative for diarrhea, blood in stool, hematemesis, jaundice and rectal bleeding. . ; ; Genitourinary: Negative for dysuria, flank pain and hematuria. ; ; Musculoskeletal: Negative for back pain and neck pain. Negative for swelling and trauma.; ; Skin: Negative for pruritus, rash, abrasions, blisters, bruising and skin lesion.; ; Neuro: Negative for headache, lightheadedness and neck stiffness. Negative for altered level of consciousness , altered mental status, extremity weakness, paresthesias, involuntary movement, seizure  and syncope.        Allergies  Review of patient's allergies indicates no known allergies.  Home Medications   Prior to Admission medications   Medication Sig Start Date End Date Taking? Authorizing Provider  ondansetron (ZOFRAN ODT) 8 MG disintegrating tablet Take 1 tablet (8 mg total) by mouth every 8 (eight) hours as needed for nausea. 08/24/13  Yes Reuben Likesavid C Keller, MD  oxyCODONE-acetaminophen (PERCOCET) 5-325 MG per tablet 1 to 2 tablets every 6 hours as needed for pain. 08/26/13  Yes Rodolph BongEvan S Corey, MD  clindamycin (CLEOCIN) 300 MG capsule Take 1 capsule (300 mg total) by mouth 4 (four) times daily. 08/24/13   Reuben Likesavid C Keller, MD  predniSONE (DELTASONE) 20 MG tablet Take 1 tablet (20 mg total) by mouth 2 (two) times daily. 08/24/13   Reuben Likesavid C Keller, MD   BP 120/78  Pulse 78  Temp(Src) 97.8 F (36.6 C) (Oral)  Resp 18  SpO2 100%  LMP 09/02/2013 Physical Exam 1405: Physical examination:  Nursing notes reviewed; Vital signs and O2 SAT reviewed;  Constitutional: Well developed, Well nourished, Well hydrated, In no acute distress; Head:  Normocephalic, atraumatic; Eyes: EOMI, PERRL, No scleral icterus; ENMT: Mouth and pharynx normal, Mucous membranes moist; Neck: Supple, Full range of motion, No lymphadenopathy; Cardiovascular: Regular rate and rhythm, No murmur, rub, or gallop; Respiratory: Breath sounds clear & equal bilaterally, No rales, rhonchi, wheezes.  Speaking full sentences with ease, Normal respiratory effort/excursion; Chest: Nontender, Movement normal; Abdomen: Soft, +mid-epigastric and LUQ tenderness to palp. No rebound or guarding. Nondistended, Normal bowel sounds; Genitourinary: No CVA  tenderness; Extremities: Pulses normal, No tenderness, No edema, No calf edema or asymmetry.; Neuro: AA&Ox3, Major CN grossly intact.  Speech clear. No gross focal motor or sensory deficits in extremities. Climbs on and off stretcher easily by herself. Gait steady.; Skin: Color normal, Warm,  Dry.   ED Course  Procedures     EKG Interpretation None      MDM  MDM Reviewed: previous chart, nursing note and vitals Reviewed previous: labs Interpretation: labs, ultrasound and x-ray    Results for orders placed during the hospital encounter of 09/02/13  URINALYSIS, ROUTINE W REFLEX MICROSCOPIC      Result Value Ref Range   Color, Urine YELLOW  YELLOW   APPearance CLEAR  CLEAR   Specific Gravity, Urine 1.015  1.005 - 1.030   pH 8.0  5.0 - 8.0   Glucose, UA NEGATIVE  NEGATIVE mg/dL   Hgb urine dipstick LARGE (*) NEGATIVE   Bilirubin Urine SMALL (*) NEGATIVE   Ketones, ur 15 (*) NEGATIVE mg/dL   Protein, ur TRACE (*) NEGATIVE mg/dL   Urobilinogen, UA 0.2  0.0 - 1.0 mg/dL   Nitrite NEGATIVE  NEGATIVE   Leukocytes, UA NEGATIVE  NEGATIVE  PREGNANCY, URINE      Result Value Ref Range   Preg Test, Ur NEGATIVE  NEGATIVE  CBC WITH DIFFERENTIAL      Result Value Ref Range   WBC 8.7  4.0 - 10.5 K/uL   RBC 4.34  3.87 - 5.11 MIL/uL   Hemoglobin 13.0  12.0 - 15.0 g/dL   HCT 16.1  09.6 - 04.5 %   MCV 89.6  78.0 - 100.0 fL   MCH 30.0  26.0 - 34.0 pg   MCHC 33.4  30.0 - 36.0 g/dL   RDW 40.9  81.1 - 91.4 %   Platelets 297  150 - 400 K/uL   Neutrophils Relative % 68  43 - 77 %   Neutro Abs 6.0  1.7 - 7.7 K/uL   Lymphocytes Relative 23  12 - 46 %   Lymphs Abs 2.0  0.7 - 4.0 K/uL   Monocytes Relative 8  3 - 12 %   Monocytes Absolute 0.7  0.1 - 1.0 K/uL   Eosinophils Relative 1  0 - 5 %   Eosinophils Absolute 0.1  0.0 - 0.7 K/uL   Basophils Relative 0  0 - 1 %   Basophils Absolute 0.0  0.0 - 0.1 K/uL  COMPREHENSIVE METABOLIC PANEL      Result Value Ref Range   Sodium 141  137 - 147 mEq/L   Potassium 3.8  3.7 - 5.3 mEq/L   Chloride 104  96 - 112 mEq/L   CO2 24  19 - 32 mEq/L   Glucose, Bld 107 (*) 70 - 99 mg/dL   BUN 12  6 - 23 mg/dL   Creatinine, Ser 7.82  0.50 - 1.10 mg/dL   Calcium 9.1  8.4 - 95.6 mg/dL   Total Protein 7.6  6.0 - 8.3 g/dL   Albumin 3.7  3.5 -  5.2 g/dL   AST 22  0 - 37 U/L   ALT 24  0 - 35 U/L   Alkaline Phosphatase 82  39 - 117 U/L   Total Bilirubin 0.6  0.3 - 1.2 mg/dL   GFR calc non Af Amer 90 (*) >90 mL/min   GFR calc Af Amer >90  >90 mL/min  LIPASE, BLOOD      Result Value Ref Range   Lipase  18  11 - 59 U/L  URINE MICROSCOPIC-ADD ON      Result Value Ref Range   Squamous Epithelial / LPF FEW (*) RARE   WBC, UA 0-2  <3 WBC/hpf   RBC / HPF 7-10  <3 RBC/hpf   [place] Abdomen Complete 09/02/2013   CLINICAL DATA:  Right upper quadrant abdominal pain nausea and vomiting.  EXAM: ULTRASOUND ABDOMEN COMPLETE  COMPARISON:  Radiographs dated 09/02/2013.  FINDINGS: Gallbladder:  No gallstones or wall thickening visualized. No sonographic Murphy sign noted.  Common bile duct:  Diameter: 2.6 mm  Liver:  Diffusely echogenic.  IVC:  No abnormality visualized.  Pancreas:  Visualized portion unremarkable.  Spleen:  Size and appearance within normal limits.  Right Kidney:  Length: 12.4 cm. Echogenicity within normal limits. No mass or hydronephrosis visualized.  Left Kidney:  Length: 11.9 cm. Echogenicity within normal limits. No mass or hydronephrosis visualized.  Abdominal aorta:  No aneurysm visualized.  Other findings:  None.  IMPRESSION: Diffusely echogenic liver, most likely due to steatosis. Otherwise, normal examination.   Electronically Signed   By: Gordan PaymentSteve  Reid M.D.   On: 09/02/2013 15:57   Dg Abd Acute W/chest 09/02/2013   ADDENDUM REPORT: 09/02/2013 15:58  ADDENDUM: The impression should read as follows: No acute abnormality.   Electronically Signed   By: Gordan PaymentSteve  Reid M.D.   On: 09/02/2013 15:58  09/02/2013   CLINICAL DATA:  Upper abdominal pain and vomiting.  EXAM: ACUTE ABDOMEN SERIES (ABDOMEN 2 VIEW & CHEST 1 VIEW)  COMPARISON:  None.  FINDINGS: Normal sized heart. Clear lungs. Normal bowel gas pattern. Intrauterine device in the central pelvis. Lumbar and thoracic spine degenerative changes.  IMPRESSION: Acute abnormality.  Electronically  Signed: By: Gordan PaymentSteve  Reid M.D. On: 09/02/2013 15:30    1635:  Pt has tol PO well while in the ED without N/V.  No stooling while in the ED.  Abd benign, VSS. Feels better and wants to go home now.  Will tx symptomatically at this time. Dx and testing d/w pt.  Questions answered.  Verb understanding, agreeable to d/c home with outpt f/u.      Laray AngerKathleen M Chena Chohan, DO 09/05/13 1200

## 2013-09-02 NOTE — ED Notes (Signed)
Pt c/o n/v daily x 1 week. Denies diarrhea. C/o pain to chest "feels like someone sitting on chest", points to center of chest and states goes through to back. Denies cough/sob. Pt mm Wet.

## 2013-09-05 ENCOUNTER — Telehealth: Payer: Self-pay

## 2013-09-05 NOTE — Telephone Encounter (Signed)
Yes, put in an urgent slot in next few days.

## 2013-09-05 NOTE — Telephone Encounter (Signed)
LMOM to call back

## 2013-09-05 NOTE — Telephone Encounter (Signed)
Pt when to the ER on 09/02/13 for throat infection but now that is better. She is having some abd pain and vomiting with nausea. She vomits every morning when she gets up and she stated that she was not pregnant. She had an US done in the ER. Her throat is better but she can only keep down Jell-O, Jamie Aguilar-ale and some baby food. Do she need to come in before June 3 or will she be ok to wait. She will be a new patient. Please advise

## 2013-09-05 NOTE — Telephone Encounter (Signed)
Pt is coming on Tuesday at 11:00 to see AS

## 2013-09-06 ENCOUNTER — Encounter: Payer: Self-pay | Admitting: Gastroenterology

## 2013-09-06 ENCOUNTER — Ambulatory Visit (INDEPENDENT_AMBULATORY_CARE_PROVIDER_SITE_OTHER): Payer: 59 | Admitting: Gastroenterology

## 2013-09-06 ENCOUNTER — Other Ambulatory Visit: Payer: Self-pay | Admitting: Gastroenterology

## 2013-09-06 ENCOUNTER — Other Ambulatory Visit: Payer: Self-pay | Admitting: Internal Medicine

## 2013-09-06 ENCOUNTER — Encounter (HOSPITAL_COMMUNITY): Payer: Self-pay | Admitting: Pharmacy Technician

## 2013-09-06 VITALS — BP 148/92 | HR 82 | Temp 98.3°F | Ht 64.0 in | Wt 320.6 lb

## 2013-09-06 DIAGNOSIS — R112 Nausea with vomiting, unspecified: Secondary | ICD-10-CM

## 2013-09-06 DIAGNOSIS — R109 Unspecified abdominal pain: Secondary | ICD-10-CM

## 2013-09-06 DIAGNOSIS — R1013 Epigastric pain: Secondary | ICD-10-CM | POA: Insufficient documentation

## 2013-09-06 NOTE — Patient Instructions (Signed)
Start taking Nexium once each morning, 30 minutes before breakfast.   You have been scheduled for an upper endoscopy with Dr. Jena Gaussourk.  Further recommendations to follow!

## 2013-09-06 NOTE — Progress Notes (Signed)
Primary Care Physician:  No PCP Per Patient Primary Gastroenterologist:  Dr. Jena Gaussourk  Chief Complaint  Patient presents with  . Nausea  . Emesis    HPI:   Jamie Aguilar presents today at the request of the ED. Notes symptom onset around April 17th. Woke up with sore throat, worsened, had to present to the ED due to difficulty swallowing, drinking. Strep and Mono both negative. Given shot of PCN. 2 days later had to go back with severe sore throat. Given Prednisone and Clindamycin from Dr. Lorenz CoasterKeller with Urgent Care. Went back 2 days later and [place] (Dr. Jearld FentonByers). By this point, throat had cleared up, told it was "dead tissue" on tonsils. Since recovering from this, notes intermittent N/V with vomiting whole food. Reports consistent, intermittent N/V. Epigastric pain/chest discomfort radiating to back after N/V. Epigastric pain worse with eating. Eating baby food, jello. Last night had whole cup of jello but just felt too full. Early satiety. Vomiting every morning upon waking. Little bit of reflux in the evenings. Feels very weak. Zofran prn nausea. Lost 23 lbs since April 16th. Weighed 343 at that time, now down to 320. No lower GI issues. No melena. Stool looks greasy. Vomiting bile. No dysphagia currently. Vomiting at least 3 times per day.    Past Medical History  Diagnosis Date  . HTN (hypertension)     Past Surgical History  Procedure Laterality Date  . Cesarean section    . Abscess drainage      Current Outpatient Prescriptions  Medication Sig Dispense Refill  . ondansetron (ZOFRAN ODT) 8 MG disintegrating tablet Take 1 tablet (8 mg total) by mouth every 8 (eight) hours as needed for nausea.  20 tablet  0   No current facility-administered medications for this visit.    Allergies as of 09/06/2013  . (No Known Allergies)    Family History  Problem Relation Age of Onset  . Colon cancer Neg Hx     History   Social History  .  Marital Status: Married    Spouse Name: N/A    Number of Children: N/A  . Years of Education: N/A   Occupational History  . 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 Smoking status: Current Some Day Smoker  . Smokeless tobacco: Not on file  . Alcohol Use: No  . Drug Use: No  . Sexual Activity: Yes   Other Topics Concern  . Not on file   Social History Narrative  . No narrative on file    Review of Systems: Gen: Denies any fever, chills, fatigue, weight loss, lack of appetite.  CV: Denies chest pain, heart palpitations, peripheral edema, syncope.  Resp: +cough with nausea/vomiting GI: see HPI GU : Denies urinary burning, urinary frequency, urinary hesitancy MS: +muscle weakness Derm: Denies rash, itching, dry skin Psych: Denies depression, anxiety, memory loss, and confusion Heme: Denies bruising, bleeding, and enlarged lymph nodes.  Physical Exam: BP 148/92  Pulse 82  Temp(Src) 98.3 F (36.8 C) (Oral)  Ht 5\' 4"  (1.626 m)  Wt 320 lb 9.6 oz (145.423 kg)  BMI 55.00 kg/m2  LMP 09/02/2013 General:   Alert and oriented. Pleasant and cooperative. Well-nourished and well-developed.  Head:  Normocephalic and atraumatic. Eyes:  Without icterus, sclera clear and conjunctiva pink.  Ears:  Normal auditory acuity. Nose:  No deformity, discharge,  or lesions. Mouth:  No deformity  or lesions, oral mucosa pink.  Neck:  Supple, without mass or thyromegaly. Lungs:  Clear to auscultation bilaterally. No wheezes, rales, or rhonchi. No distress.  Heart:  S1, S2 present without murmurs appreciated.  Abdomen:  +BS, soft, TTP LUQ and non-distended. No HSM noted. No guarding or rebound. No masses appreciated.  Rectal:  Deferred  Msk:  Symmetrical without gross deformities. Normal posture. Pulses:  Normal pulses noted. Extremities:  Without clubbing or edema. Neurologic:  Alert and  oriented x4;  grossly normal neurologically. Skin:  Intact  without significant lesions or rashes. Cervical Nodes:  No significant cervical adenopathy. Psych:  Alert and cooperative. Normal mood and affect.   Lab Results  Component Value Date   WBC 8.7 09/02/2013   HGB 13.0 09/02/2013   HCT 38.9 09/02/2013   MCV 89.6 09/02/2013   PLT 297 09/02/2013   Lab Results  Component Value Date   ALT 24 09/02/2013   AST 22 09/02/2013   ALKPHOS 82 09/02/2013   BILITOT 0.6 09/02/2013   Lab Results  Component Value Date   LIPASE 18 09/02/2013   Lab Results  Component Value Date   CREATININE 0.87 09/02/2013   BUN 12 09/02/2013   NA 141 09/02/2013   K 3.8 09/02/2013   CL 104 09/02/2013   CO2 24 09/02/2013   US abdomen 5/1: Diffusely echogenic liver, most likely due to steatosis.

## 2013-09-07 NOTE — Assessment & Plan Note (Signed)
Continue supportive measures. Nexium samples provided.

## 2013-09-07 NOTE — Assessment & Plan Note (Addendum)
28 year old female with new onset dyspepsia after treatment for ?tonsillitis/possible strep in April, with associated nausea and vomiting. Persistent epigastric pain, N/V, inability to tolerate anything other than soft foods. Gallbladder remains in situ. Worsening symptoms, occasional reflux. No melena noted. Labs unimpressive, US normal except for fatty liver. Needs EGD for further evaluation. Consider HIDA scan if negative EGD.  Proceed with upper endoscopy in the near future with Dr. Jena Gaussourk. The risks, benefits, and alternatives have been discussed in detail with patient. They have stated understanding and desire to proceed.  Start Nexium once daily

## 2013-09-08 ENCOUNTER — Encounter (HOSPITAL_COMMUNITY)
Admission: RE | Disposition: A | Discharge status: Home / Self Care | Payer: Self-pay | Source: Ambulatory Visit | Attending: Internal Medicine

## 2013-09-08 ENCOUNTER — Encounter (HOSPITAL_COMMUNITY): Payer: Self-pay | Admitting: *Deleted

## 2013-09-08 ENCOUNTER — Ambulatory Visit (HOSPITAL_COMMUNITY)
Admission: RE | Admit: 2013-09-08 | Discharge: 2013-09-08 | Disposition: A | Discharge status: Home / Self Care | Payer: 59 | Source: Ambulatory Visit | Attending: Internal Medicine | Admitting: Internal Medicine

## 2013-09-08 DIAGNOSIS — R634 Abnormal weight loss: Secondary | ICD-10-CM | POA: Insufficient documentation

## 2013-09-08 DIAGNOSIS — R112 Nausea with vomiting, unspecified: Secondary | ICD-10-CM

## 2013-09-08 DIAGNOSIS — F17200 Nicotine dependence, unspecified, uncomplicated: Secondary | ICD-10-CM | POA: Insufficient documentation

## 2013-09-08 DIAGNOSIS — R109 Unspecified abdominal pain: Secondary | ICD-10-CM

## 2013-09-08 DIAGNOSIS — K2100 Gastro-esophageal reflux disease with esophagitis, without bleeding: Secondary | ICD-10-CM | POA: Insufficient documentation

## 2013-09-08 DIAGNOSIS — K2210 Ulcer of esophagus without bleeding: Secondary | ICD-10-CM | POA: Insufficient documentation

## 2013-09-08 DIAGNOSIS — K269 Duodenal ulcer, unspecified as acute or chronic, without hemorrhage or perforation: Secondary | ICD-10-CM | POA: Insufficient documentation

## 2013-09-08 DIAGNOSIS — R1013 Epigastric pain: Secondary | ICD-10-CM

## 2013-09-08 DIAGNOSIS — Z6841 Body Mass Index (BMI) 40.0 and over, adult: Secondary | ICD-10-CM | POA: Insufficient documentation

## 2013-09-08 DIAGNOSIS — R6881 Early satiety: Secondary | ICD-10-CM | POA: Insufficient documentation

## 2013-09-08 DIAGNOSIS — K2940 Chronic atrophic gastritis without bleeding: Secondary | ICD-10-CM | POA: Insufficient documentation

## 2013-09-08 DIAGNOSIS — I10 Essential (primary) hypertension: Secondary | ICD-10-CM | POA: Insufficient documentation

## 2013-09-08 DIAGNOSIS — K2960 Other gastritis without bleeding: Secondary | ICD-10-CM | POA: Insufficient documentation

## 2013-09-08 DIAGNOSIS — K3189 Other diseases of stomach and duodenum: Secondary | ICD-10-CM | POA: Insufficient documentation

## 2013-09-08 DIAGNOSIS — K228 Other specified diseases of esophagus: Secondary | ICD-10-CM | POA: Insufficient documentation

## 2013-09-08 DIAGNOSIS — K2289 Other specified disease of esophagus: Secondary | ICD-10-CM | POA: Insufficient documentation

## 2013-09-08 HISTORY — PX: ESOPHAGOGASTRODUODENOSCOPY: SHX5428

## 2013-09-08 SURGERY — EGD (ESOPHAGOGASTRODUODENOSCOPY)
Anesthesia: Moderate Sedation

## 2013-09-08 MED ORDER — PROMETHAZINE HCL 25 MG/ML IJ SOLN
INTRAMUSCULAR | Status: AC
  Filled 2013-09-08: qty 1

## 2013-09-08 MED ORDER — LIDOCAINE VISCOUS 2 % MT SOLN
OROMUCOSAL | Status: AC
  Filled 2013-09-08: qty 15

## 2013-09-08 MED ORDER — SODIUM CHLORIDE 0.9 % IJ SOLN
INTRAMUSCULAR | Status: AC
  Filled 2013-09-08: qty 10

## 2013-09-08 MED ORDER — MIDAZOLAM HCL 5 MG/5ML IJ SOLN
INTRAMUSCULAR | Status: DC | PRN
  Administered 2013-09-08: 2 mg via INTRAVENOUS
  Administered 2013-09-08: 1 mg via INTRAVENOUS
  Administered 2013-09-08: 2 mg via INTRAVENOUS
  Administered 2013-09-08: 1 mg via INTRAVENOUS

## 2013-09-08 MED ORDER — ONDANSETRON HCL 4 MG/2ML IJ SOLN
INTRAMUSCULAR | Status: AC
  Filled 2013-09-08: qty 2

## 2013-09-08 MED ORDER — MEPERIDINE HCL 100 MG/ML IJ SOLN
INTRAMUSCULAR | Status: DC | PRN
  Administered 2013-09-08: 50 mg via INTRAVENOUS
  Administered 2013-09-08 (×4): 25 mg via INTRAVENOUS

## 2013-09-08 MED ORDER — STERILE WATER FOR IRRIGATION IR SOLN
Status: DC | PRN
  Administered 2013-09-08: 14:00:00

## 2013-09-08 MED ORDER — PROMETHAZINE HCL 25 MG/ML IJ SOLN
25.0000 mg | Freq: Once | INTRAMUSCULAR | Status: AC
  Administered 2013-09-08: 25 mg via INTRAVENOUS

## 2013-09-08 MED ORDER — SODIUM CHLORIDE 0.9 % IV SOLN
INTRAVENOUS | Status: DC
  Administered 2013-09-08: 13:00:00 via INTRAVENOUS

## 2013-09-08 MED ORDER — MIDAZOLAM HCL 5 MG/5ML IJ SOLN
INTRAMUSCULAR | Status: AC
  Filled 2013-09-08: qty 10

## 2013-09-08 MED ORDER — ONDANSETRON HCL 4 MG/2ML IJ SOLN
INTRAMUSCULAR | Status: DC | PRN
  Administered 2013-09-08: 4 mg via INTRAVENOUS

## 2013-09-08 MED ORDER — MEPERIDINE HCL 100 MG/ML IJ SOLN
INTRAMUSCULAR | Status: AC
  Filled 2013-09-08: qty 2

## 2013-09-08 NOTE — Op Note (Signed)
[facility] [address] [place], [id number]   ENDOSCOPY PROCEDURE REPORT  PATIENT: Jamie Aguilar, [name]  MR#: [id number] BIRTHDATE: 03-20-1986 , 27  yrs. old GENDER: Female ENDOSCOPIST: R.  Roetta SessionsMichael Rourk, MD FACP [facility] REFERRED BY:     none PROCEDURE DATE:  09/08/2013 PROCEDURE:     EGD with gastric biopsy  INDICATIONS:     Dyspepsia; nausea and vomiting; NSAID exposure  INFORMED CONSENT:   The risks, benefits, limitations, alternatives and imponderables have been discussed.  The potential for biopsy, esophogeal dilation, etc. have also been reviewed.  Questions have been answered.  All parties agreeable.  Please see the history and physical in the medical record for more information.  MEDICATIONS:     Versed 6 mg IV and Demerol 150 mg IV in divided doses. Zofran 4 mg IV and Phenergan 25 mg IV. Xylocaine gel orally  DESCRIPTION OF PROCEDURE:   The [id number] ([id number]  endoscope was introduced through the mouth and advanced to the second portion of the duodenum without difficulty or limitations.  The mucosal surfaces were surveyed very carefully during advancement of the scope and upon withdrawal.  Retroflexion view of the proximal stomach and esophagogastric junction was performed.      FINDINGS:  Circumferential distal esophageal erosions with at least one 3 mm area ulceration along with linear erosions extending up from the GE junction a good 2 cm in a couple of places. No Barrett's esophagus. Stomach empty Linear gastric erosions present in the antrum.. No ulcer or infiltrating process. Pylorus patent. Examination of bulb and second portion revealed an  edematous posterior bulbar folds with patchy erythema; there was a 8 mm posterior bulbar ulcer present with a clean base. Otherwise, the remainder of the first and second portion of the duodenum appeared normal.  THERAPEUTIC / DIAGNOSTIC MANEUVERS PERFORMED:  Biopsies of the abnormal gastric mucosa  taken for histologic study   COMPLICATIONS:  None  IMPRESSION:  Erosive/ulcerative reflux esophagitis. Gastric erosions. Duodenal bulbar ulcer with associated erosions-status post gastric biopsy  RECOMMENDATIONS:  Absolutely refrain from using all forms of nonsteroidals for now. Continue Nexium 40 mg orally daily. Add Carafate suspension 1 g 4 times daily x2 weeks. Followup on pathology.  Office visit with us in 3 months.    _______________________________ R. Roetta SessionsMichael Rourk, MD FACP [facility] eSigned:  R. Roetta SessionsMichael Rourk, MD FACP [facility] 09/08/2013 2:36 PM     CC:  PATIENT NAME:  Jamie Aguilar, Pat L. MR#: [id number]

## 2013-09-08 NOTE — Interval H&P Note (Signed)
History and Physical Interval Note:  09/08/2013 1:47 PM  Jamie ShipStephanie L Aguilar  has presented today for surgery, with the diagnosis of ABDOMINAL PAIN  AND NAUSEA AND VOMITING  The various methods of treatment have been discussed with the patient and family. After consideration of risks, benefits and other options for treatment, the patient has consented to  Procedure(s) with comments: ESOPHAGOGASTRODUODENOSCOPY (EGD) (N/A) - 1:15 as a surgical intervention .  The patient's history has been reviewed, patient examined, no change in status, stable for surgery.  I have reviewed the patient's chart and labs.  Questions were answered to the patient's satisfaction.     No change. EGD per plan. The risks, benefits, limitations, alternatives and imponderables have been reviewed with the patient. Potential for esophageal dilation, biopsy, etc. have also been reviewed.  Questions have been answered. All parties agreeable.  Gerrit Friendsobert M Khaleef Ruby

## 2013-09-08 NOTE — Discharge Instructions (Signed)
EGD Discharge instructions Please read the instructions outlined below and refer to this sheet in the next few weeks. These discharge instructions provide you with general information on caring for yourself after you leave the hospital. Your doctor may also give you specific instructions. While your treatment has been planned according to the most current medical practices available, unavoidable complications occasionally occur. If you have any problems or questions after discharge, please call your doctor. ACTIVITY  You may resume your regular activity but move at a slower pace for the next 24 hours.   Take frequent rest periods for the next 24 hours.   Walking will help expel (get rid of) the air and reduce the bloated feeling in your abdomen.   No driving for 24 hours (because of the anesthesia (medicine) used during the test).   You may shower.   Do not sign any important legal documents or operate any machinery for 24 hours (because of the anesthesia used during the test).  NUTRITION  Drink plenty of fluids.   You may resume your normal diet.   Begin with a light meal and progress to your normal diet.   Avoid alcoholic beverages for 24 hours or as instructed by your caregiver.  MEDICATIONS  You may resume your normal medications unless your caregiver tells you otherwise.  WHAT YOU CAN EXPECT TODAY  You may experience abdominal discomfort such as a feeling of fullness or gas pains.  FOLLOW-UP  Your doctor will discuss the results of your test with you.  SEEK IMMEDIATE MEDICAL ATTENTION IF ANY OF THE FOLLOWING OCCUR:  Excessive nausea (feeling sick to your stomach) and/or vomiting.   Severe abdominal pain and distention (swelling).   Trouble swallowing.   Temperature over 101 F (37.8 C).   Rectal bleeding or vomiting of blood.   Peptic Ulcer A peptic ulcer is a sore in the lining of in your esophagus (esophageal ulcer), stomach (gastric ulcer), or in the first  part of your small intestine (duodenal ulcer). The ulcer causes erosion into the deeper tissue. CAUSES  Normally, the lining of the stomach and the small intestine protects itself from the acid that digests food. The protective lining can be damaged by:  An infection caused by a bacterium called Helicobacter pylori (H. pylori).  Regular use of nonsteroidal anti-inflammatory drugs (NSAIDs), such as ibuprofen or aspirin.  Smoking tobacco. Other risk factors include being older than 50, drinking alcohol excessively, and having a family history of ulcer disease.  SYMPTOMS   Burning pain or gnawing in the area between the chest and the belly button.  Heartburn.  Nausea and vomiting.  Bloating. The pain can be worse on an empty stomach and at night. If the ulcer results in bleeding, it can cause:  Black, tarry stools.  Vomiting of bright red blood.  Vomiting of coffee ground looking materials. DIAGNOSIS  A diagnosis is usually made based upon your history and an exam. Other tests and procedures may be performed to find the cause of the ulcer. Finding a cause will help determine the best treatment. Tests and procedures may include:  Blood tests, stool tests, or breath tests to check for the bacterium H. pylori.  An upper gastrointestinal (GI) series of the esophagus, stomach, and small intestine.  An endoscopy to examine the esophagus, stomach, and small intestine.  A biopsy. TREATMENT  Treatment may include:  Eliminating the cause of the ulcer, such as smoking, NSAIDs, or alcohol.  Medicines to reduce the amount of acid  in your digestive tract.  Antibiotic medicines if the ulcer is caused by the H. pylori bacterium.  An upper endoscopy to treat a bleeding ulcer.  Surgery if the bleeding is severe or if the ulcer created a hole somewhere in the digestive system. HOME CARE INSTRUCTIONS   Avoid tobacco, alcohol, and caffeine. Smoking can increase the acid in the stomach, and  continued smoking will impair the healing of ulcers.  Avoid foods and drinks that seem to cause discomfort or aggravate your ulcer.  Only take medicines as directed by your caregiver. Do not substitute over-the-counter medicines for prescription medicines without talking to your caregiver.  Keep any follow-up appointments and tests as directed. SEEK MEDICAL CARE IF:   Your do not improve within 7 days of starting treatment.  You have ongoing indigestion or heartburn. SEEK IMMEDIATE MEDICAL CARE IF:   You have sudden, sharp, or persistent abdominal pain.  You have bloody or dark black, tarry stools.  You vomit blood or vomit that looks like coffee grounds.  You become light headed, weak, or feel faint.  You become sweaty or clammy. MAKE SURE YOU:   Understand these instructions.  Will watch your condition.  Will get help right away if you are not doing well or get worse.  Gastroesophageal Reflux Disease, Adult Gastroesophageal reflux disease (GERD) happens when acid from your stomach flows up into the esophagus. When acid comes in contact with the esophagus, the acid causes soreness (inflammation) in the esophagus. Over time, GERD may create small holes (ulcers) in the lining of the esophagus. CAUSES   Increased body weight. This puts pressure on the stomach, making acid rise from the stomach into the esophagus.  Smoking. This increases acid production in the stomach.  Drinking alcohol. This causes decreased pressure in the lower esophageal sphincter (valve or ring of muscle between the esophagus and stomach), allowing acid from the stomach into the esophagus.  Late evening meals and a full stomach. This increases pressure and acid production in the stomach.  A malformed lower esophageal sphincter. Sometimes, no cause is found. SYMPTOMS   Burning pain in the lower part of the mid-chest behind the breastbone and in the mid-stomach area. This may occur twice a week or more  often.  Trouble swallowing.  Sore throat.  Dry cough.  Asthma-like symptoms including chest tightness, shortness of breath, or wheezing. DIAGNOSIS  Your caregiver may be able to diagnose GERD based on your symptoms. In some cases, X-rays and other tests may be done to check for complications or to check the condition of your stomach and esophagus. TREATMENT  Your caregiver may recommend over-the-counter or prescription medicines to help decrease acid production. Ask your caregiver before starting or adding any new medicines.  HOME CARE INSTRUCTIONS   Change the factors that you can control. Ask your caregiver for guidance concerning weight loss, quitting smoking, and alcohol consumption.  Avoid foods and drinks that make your symptoms worse, such as:  Caffeine or alcoholic drinks.  Chocolate.  Peppermint or mint flavorings.  Garlic and onions.  Spicy foods.  Citrus fruits, such as oranges, lemons, or limes.  Tomato-based foods such as sauce, chili, salsa, and pizza.  Fried and fatty foods.  Avoid lying down for the 3 hours prior to your bedtime or prior to taking a nap.  Eat small, frequent meals instead of large meals.  Wear loose-fitting clothing. Do not wear anything tight around your waist that causes pressure on your stomach.  Raise the head  of your bed 6 to 8 inches with wood blocks to help you sleep. Extra pillows will not help.  Only take over-the-counter or prescription medicines for pain, discomfort, or fever as directed by your caregiver.  Do not take aspirin, ibuprofen, or other nonsteroidal anti-inflammatory drugs (NSAIDs). SEEK IMMEDIATE MEDICAL CARE IF:   You have pain in your arms, neck, jaw, teeth, or back.  Your pain increases or changes in intensity or duration.  You develop nausea, vomiting, or sweating (diaphoresis).  You develop shortness of breath, or you faint.  Your vomit is green, yellow, black, or looks like coffee grounds or  blood.  Your stool is red, bloody, or black. These symptoms could be signs of other problems, such as heart disease, gastric bleeding, or esophageal bleeding. MAKE SURE YOU:   Understand these instructions.  Will watch your condition.  Will get help right away if you are not doing well or get worse.  Peptic ulcer and GERD information provided  Continue Nexium 40 mg daily  Add Carafate suspension 1 g 4 times daily to your regimen for 2 weeks  Absolutely avoid all forms of nonsteroidals including Advil and aspirin  Office visit with us in 3 months  Further recommendations to follow pending review of pathology

## 2013-09-08 NOTE — H&P (View-Only) (Signed)
   Primary Care Physician:  No PCP Per Patient Primary Gastroenterologist:  Dr. Rourk  Chief Complaint  Patient presents with  . Nausea  . Emesis    HPI:   Jamie Aguilar presents today at the request of the ED. Notes symptom onset around April 17th. Woke up with sore throat, worsened, had to present to the ED due to difficulty swallowing, drinking. Strep and Mono both negative. Given shot of PCN. 2 days later had to go back with severe sore throat. Given Prednisone and Clindamycin from Dr. Keller with Urgent Care. Went back 2 days later and referred to ENT specialist in [place] (Dr. Byers). By this point, throat had cleared up, told it was "dead tissue" on tonsils. Since recovering from this, notes intermittent N/V with vomiting whole food. Reports consistent, intermittent N/V. Epigastric pain/chest discomfort radiating to back after N/V. Epigastric pain worse with eating. Eating baby food, jello. Last night had whole cup of jello but just felt too full. Early satiety. Vomiting every morning upon waking. Little bit of reflux in the evenings. Feels very weak. Zofran prn nausea. Lost 23 lbs since April 16th. Weighed 343 at that time, now down to 320. No lower GI issues. No melena. Stool looks greasy. Vomiting bile. No dysphagia currently. Vomiting at least 3 times per day.    Past Medical History  Diagnosis Date  . HTN (hypertension)     Past Surgical History  Procedure Laterality Date  . Cesarean section    . Abscess drainage      Current Outpatient Prescriptions  Medication Sig Dispense Refill  . ondansetron (ZOFRAN ODT) 8 MG disintegrating tablet Take 1 tablet (8 mg total) by mouth every 8 (eight) hours as needed for nausea.  20 tablet  0   No current facility-administered medications for this visit.    Allergies as of 09/06/2013  . (No Known Allergies)    Family History  Problem Relation Age of Onset  . Colon cancer Neg Hx     History   Social History  .  Marital Status: Married    Spouse Name: N/A    Number of Children: N/A  . Years of Education: N/A   Occupational History  . Nurse [employer]   Social History Main Topics  . Smoking status: Current Some Day Smoker  . Smokeless tobacco: Not on file  . Alcohol Use: No  . Drug Use: No  . Sexual Activity: Yes   Other Topics Concern  . Not on file   Social History Narrative  . No narrative on file    Review of Systems: Gen: Denies any fever, chills, fatigue, weight loss, lack of appetite.  CV: Denies chest pain, heart palpitations, peripheral edema, syncope.  Resp: +cough with nausea/vomiting GI: see HPI GU : Denies urinary burning, urinary frequency, urinary hesitancy MS: +muscle weakness Derm: Denies rash, itching, dry skin Psych: Denies depression, anxiety, memory loss, and confusion Heme: Denies bruising, bleeding, and enlarged lymph nodes.  Physical Exam: BP 148/92  Pulse 82  Temp(Src) 98.3 F (36.8 C) (Oral)  Ht 5' 4" (1.626 m)  Wt 320 lb 9.6 oz (145.423 kg)  BMI 55.00 kg/m2  LMP 09/02/2013 General:   Alert and oriented. Pleasant and cooperative. Well-nourished and well-developed.  Head:  Normocephalic and atraumatic. Eyes:  Without icterus, sclera clear and conjunctiva pink.  Ears:  Normal auditory acuity. Nose:  No deformity, discharge,  or lesions. Mouth:  No deformity   or lesions, oral mucosa pink.  Neck:  Supple, without mass or thyromegaly. Lungs:  Clear to auscultation bilaterally. No wheezes, rales, or rhonchi. No distress.  Heart:  S1, S2 present without murmurs appreciated.  Abdomen:  +BS, soft, TTP LUQ and non-distended. No HSM noted. No guarding or rebound. No masses appreciated.  Rectal:  Deferred  Msk:  Symmetrical without gross deformities. Normal posture. Pulses:  Normal pulses noted. Extremities:  Without clubbing or edema. Neurologic:  Alert and  oriented x4;  grossly normal neurologically. Skin:  Intact  without significant lesions or rashes. Cervical Nodes:  No significant cervical adenopathy. Psych:  Alert and cooperative. Normal mood and affect.   Lab Results  Component Value Date   WBC 8.7 09/02/2013   HGB 13.0 09/02/2013   HCT 38.9 09/02/2013   MCV 89.6 09/02/2013   PLT 297 09/02/2013   Lab Results  Component Value Date   ALT 24 09/02/2013   AST 22 09/02/2013   ALKPHOS 82 09/02/2013   BILITOT 0.6 09/02/2013   Lab Results  Component Value Date   LIPASE 18 09/02/2013   Lab Results  Component Value Date   CREATININE 0.87 09/02/2013   BUN 12 09/02/2013   NA 141 09/02/2013   K 3.8 09/02/2013   CL 104 09/02/2013   CO2 24 09/02/2013   US abdomen 5/1: Diffusely echogenic liver, most likely due to steatosis.

## 2013-09-08 NOTE — Progress Notes (Signed)
NO PCP

## 2013-09-13 ENCOUNTER — Encounter (HOSPITAL_COMMUNITY): Payer: Self-pay | Admitting: Internal Medicine

## 2013-09-13 ENCOUNTER — Encounter: Payer: Self-pay | Admitting: Internal Medicine

## 2013-12-06 ENCOUNTER — Ambulatory Visit (INDEPENDENT_AMBULATORY_CARE_PROVIDER_SITE_OTHER): Payer: Commercial Managed Care - PPO | Admitting: Physician Assistant

## 2013-12-06 VITALS — BP 136/84 | HR 80 | Temp 98.1°F | Resp 14 | Ht 65.0 in | Wt 328.0 lb

## 2013-12-06 DIAGNOSIS — K089 Disorder of teeth and supporting structures, unspecified: Secondary | ICD-10-CM

## 2013-12-06 DIAGNOSIS — K0889 Other specified disorders of teeth and supporting structures: Secondary | ICD-10-CM

## 2013-12-06 LAB — POCT CBC
Granulocyte percent: 70.8 %G (ref 37–80)
HCT, POC: 41.3 % (ref 37.7–47.9)
Hemoglobin: 13.2 g/dL (ref 12.2–16.2)
Lymph, poc: 2.3 (ref 0.6–3.4)
MCH, POC: 29.8 pg (ref 27–31.2)
MCHC: 32.1 g/dL (ref 31.8–35.4)
MCV: 93 fL (ref 80–97)
MID (cbc): 0.3 (ref 0–0.9)
MPV: 8.1 fL (ref 0–99.8)
POC Granulocyte: 6.3 (ref 2–6.9)
POC LYMPH PERCENT: 25.4 %L (ref 10–50)
POC MID %: 3.8 %M (ref 0–12)
Platelet Count, POC: 246 10*3/uL (ref 142–424)
RBC: 4.44 M/uL (ref 4.04–5.48)
RDW, POC: 12.9 %
WBC: 8.9 10*3/uL (ref 4.6–10.2)

## 2013-12-06 MED ORDER — HYDROCODONE-ACETAMINOPHEN 5-325 MG PO TABS: 1.0000 | ORAL_TABLET | Freq: Three times a day (TID) | ORAL | Status: DC | PRN

## 2013-12-06 MED ORDER — IBUPROFEN 800 MG PO TABS: 800.0000 mg | ORAL_TABLET | Freq: Three times a day (TID) | ORAL | Status: DC | PRN

## 2013-12-06 MED ORDER — KETOROLAC TROMETHAMINE 60 MG/2ML IM SOLN
60.0000 mg | Freq: Once | INTRAMUSCULAR | Status: AC
  Administered 2013-12-06: 60 mg via INTRAMUSCULAR

## 2013-12-06 MED ORDER — AMOXICILLIN-POT CLAVULANATE 875-125 MG PO TABS: 1.0000 | ORAL_TABLET | Freq: Two times a day (BID) | ORAL | Status: DC

## 2013-12-06 NOTE — Progress Notes (Signed)
   Subjective:    Patient ID: Beatris ShipStephanie L Hipwell, female    DOB: 05/28/1985, 28 y.o.   MRN: [id number]  HPI 28 year old female presents for evaluation of dental pain.  States she has known wisdom teeth coming in on her left side (upper and lower). Pain has been increasing x 2-3 days, with today being "unbearable."  She has had her wisdom teeth removed on the right side "years" ago.  Pain is worsening today and she is worried about the chance of infection. No trismus present. Denies fever, chills, nausea, vomiting, or dizziness. She does have an appt with her dentist on 8/19.      Review of Systems  Constitutional: Negative for fever and chills.  HENT: Positive for dental problem and ear pain. Negative for sore throat.   Respiratory: Negative for choking.   Gastrointestinal: Negative for nausea and vomiting.  Neurological: Positive for headaches.       Objective:   Physical Exam  Constitutional: She appears well-developed and well-nourished.  HENT:  Head: Normocephalic and atraumatic.  Right Ear: External ear normal.  Left Ear: External ear normal.  Mouth/Throat: Uvula is midline, oropharynx is clear and moist and mucous membranes are normal. No trismus in the jaw.  Upper gingiva swollen. +TTP. No abscess formation  Eyes: Conjunctivae are normal.  Neck: Normal range of motion.  Cardiovascular: Normal rate.   Pulmonary/Chest: Effort normal.  Neurological: She is alert.  Psychiatric: She has a normal mood and affect. Her behavior is normal. Judgment and thought content normal.    Results for orders placed in visit on 12/06/13  POCT CBC      Result Value Ref Range   WBC 8.9  4.6 - 10.2 K/uL   Lymph, poc 2.3  0.6 - 3.4   POC LYMPH PERCENT 25.4  10 - 50 %L   MID (cbc) 0.3  0 - 0.9   POC MID % 3.8  0 - 12 %M   POC Granulocyte 6.3  2 - 6.9   Granulocyte percent 70.8  37 - 80 %G   RBC 4.44  4.04 - 5.48 M/uL   Hemoglobin 13.2  12.2 - 16.2 g/dL   HCT, POC 40.941.3  81.137.7 - 47.9 %   MCV  93.0  80 - 97 fL   MCH, POC 29.8  27 - 31.2 pg   MCHC 32.1  31.8 - 35.4 g/dL   RDW, POC 91.412.9     Platelet Count, POC 246  142 - 424 K/uL   MPV 8.1  0 - 99.8 fL         Assessment & Plan:  Pain, dental - Plan: POCT CBC, ketorolac (TORADOL) injection 60 mg, amoxicillin-clavulanate (AUGMENTIN) 875-125 MG per tablet, HYDROcodone-acetaminophen (NORCO) 5-325 MG per tablet, ibuprofen (ADVIL,MOTRIN) 800 MG tablet  Toradol 60 mg IM today. Rx for ibuprofen 800 mg tid with food. Norco 5/325 mg to take q8hours prn pain Augmentin 875 mg bid x 7 days Plan to keep appt with dentist on 8/19. If symptoms acutely worsening, recommend recheck or go to ED.

## 2014-03-20 ENCOUNTER — Encounter: Payer: Self-pay | Admitting: Family Medicine

## 2014-03-20 ENCOUNTER — Ambulatory Visit (INDEPENDENT_AMBULATORY_CARE_PROVIDER_SITE_OTHER): Payer: Commercial Managed Care - PPO | Admitting: Family Medicine

## 2014-03-20 VITALS — BP 125/89 | HR 87 | Temp 98.6°F | Resp 18 | Ht 64.0 in | Wt 323.4 lb

## 2014-03-20 DIAGNOSIS — Z202 Contact with and (suspected) exposure to infections with a predominantly sexual mode of transmission: Secondary | ICD-10-CM

## 2014-03-20 DIAGNOSIS — N898 Other specified noninflammatory disorders of vagina: Secondary | ICD-10-CM

## 2014-03-20 LAB — POCT WET PREP WITH KOH
KOH Prep POC: NEGATIVE
RBC WET PREP PER HPF POC: NEGATIVE
Trichomonas, UA: NEGATIVE
Yeast Wet Prep HPF POC: NEGATIVE

## 2014-03-20 NOTE — Progress Notes (Signed)
   Subjective:    Patient ID: Jamie Aguilar, female    DOB: 09/04/1985, 28 y.o.   MRN: [id number]  HPI The patient presents today requesting screening for STIs. She had unprotected intercourse 10/31/205 and received a phone call today from an acquaintance telling her that she should be tested for STIs. She does not have any further information. She had chlamydia and HPV in the past. This has been her only sexual contact for several years.   She has an IUD and is having an IUD check later this week by her gynecologist.   Review of Systems Some watery discharge, no odor, no itching, no rash. No dysuria, no urinary frequency, no hematuria.     Objective:   Physical Exam  Constitutional: She is oriented to person, place, and time. No distress.  Obese   HENT:  Head: Normocephalic.  Eyes: Conjunctivae are normal.  Neck: Normal range of motion. Neck supple.  Cardiovascular: Normal rate.   Pulmonary/Chest: Effort normal.  Genitourinary: Vagina normal and uterus normal. Pelvic exam was performed with patient supine. No labial fusion. There is no rash, tenderness, lesion or injury on the right labia. There is no rash, tenderness, lesion or injury on the left labia. Vaginal discharge: thin white.  Difficult to visualize cervix, very anterior.   Musculoskeletal: Normal range of motion.  Neurological: She is alert and oriented to person, place, and time.  Skin: Skin is warm and dry. She is not diaphoretic.  Psychiatric: She has a normal mood and affect. Her behavior is normal. Judgment and thought content normal.  Vitals reviewed.  BP 125/89 mmHg  Pulse 87  Temp(Src) 98.6 F (37 C) (Oral)  Resp 18  Ht 5\' 4"  (1.626 m)  Wt 323 lb 6.4 oz (146.693 kg)  BMI 55.48 kg/m2  SpO2 99%  LMP 03/11/2014 POCT Wet Prep with KOH  Result Value Ref Range   Trichomonas, UA Negative    Clue Cells Wet Prep HPF POC 2-4    Epithelial Wet Prep HPF POC 2-4    Yeast Wet Prep HPF POC neg    Bacteria Wet Prep  HPF POC 1+    RBC Wet Prep HPF POC neg    WBC Wet Prep HPF POC 0-1    KOH Prep POC Negative        Assessment & Plan:  1. Vaginal discharge - POCT Wet Prep with KOH  2. STD exposure - GC/Chlamydia Probe Amp - HIV antibody - RPR   Emi Belfasteborah B. Gessner, FNP-BC  [facility]  03/21/2014 8:30 AM

## 2014-03-21 ENCOUNTER — Telehealth: Payer: Self-pay

## 2014-03-21 ENCOUNTER — Ambulatory Visit (INDEPENDENT_AMBULATORY_CARE_PROVIDER_SITE_OTHER): Payer: Commercial Managed Care - PPO | Admitting: Adult Health

## 2014-03-21 ENCOUNTER — Encounter: Payer: Self-pay | Admitting: Adult Health

## 2014-03-21 VITALS — BP 120/70 | Ht 64.0 in | Wt 327.4 lb

## 2014-03-21 DIAGNOSIS — Z30431 Encounter for routine checking of intrauterine contraceptive device: Secondary | ICD-10-CM

## 2014-03-21 DIAGNOSIS — Z975 Presence of (intrauterine) contraceptive device: Secondary | ICD-10-CM

## 2014-03-21 HISTORY — DX: Presence of (intrauterine) contraceptive device: Z97.5

## 2014-03-21 HISTORY — DX: Encounter for routine checking of intrauterine contraceptive device: Z30.431

## 2014-03-21 LAB — GC/CHLAMYDIA PROBE AMP
CT Probe RNA: NEGATIVE
GC PROBE AMP APTIMA: NEGATIVE

## 2014-03-21 LAB — HIV ANTIBODY (ROUTINE TESTING W REFLEX): HIV 1&2 Ab, 4th Generation: NONREACTIVE

## 2014-03-21 LAB — RPR

## 2014-03-21 NOTE — Patient Instructions (Signed)
Follow up prn

## 2014-03-21 NOTE — Telephone Encounter (Signed)
Patient called to get lab results. Please return call and advise.

## 2014-03-21 NOTE — Telephone Encounter (Signed)
Lmom to call back for lab results. All labs are negative.

## 2014-03-21 NOTE — Telephone Encounter (Signed)
Pt called back. Gave pt lab results.

## 2014-03-21 NOTE — Progress Notes (Signed)
Subjective:     Patient ID: Jamie Aguilar, female   DOB: 08/25/1985, 28 y.o.   MRN: [id number]  HPI Jamie Aguilar is a 28 year old white female in complaining of feeling IUD strings, was seen at urgent care,had some back pain and checked for GC/CHL awaiting results.Got IUD in 2013.  Review of Systems See HPI Reviewed past medical,surgical, social and family history. Reviewed medications and allergies.     Objective:   Physical Exam BP 120/70 mmHg  Ht 5\' 4"  (1.626 m)  Wt 327 lb 6.4 oz (148.508 kg)  BMI 56.17 kg/m2  LMP 03/06/2014 Skin warm and dry.Pelvic: external genitalia is normal in appearance, vagina:clear discharge without odor, cervix:smooth and bulbous,+IUD strings,strings shortened, uterus: normal size, shape and contour, non tender, no masses felt, adnexa: no masses or tenderness noted.Slightly difficult exam secondary to abdominal girth.    Assessment:     IUD check,strings shortened IUD in place    Plan:     Follow up prn

## 2014-09-11 ENCOUNTER — Encounter (HOSPITAL_COMMUNITY): Payer: Self-pay | Admitting: Cardiology

## 2014-09-11 ENCOUNTER — Emergency Department (HOSPITAL_COMMUNITY)
Admission: EM | Admit: 2014-09-11 | Discharge: 2014-09-11 | Disposition: A | Discharge status: Home / Self Care | Payer: 59 | Attending: Emergency Medicine | Admitting: Emergency Medicine

## 2014-09-11 ENCOUNTER — Emergency Department (HOSPITAL_COMMUNITY): Payer: 59

## 2014-09-11 DIAGNOSIS — Z3202 Encounter for pregnancy test, result negative: Secondary | ICD-10-CM | POA: Insufficient documentation

## 2014-09-11 DIAGNOSIS — I10 Essential (primary) hypertension: Secondary | ICD-10-CM | POA: Insufficient documentation

## 2014-09-11 DIAGNOSIS — N200 Calculus of kidney: Secondary | ICD-10-CM | POA: Insufficient documentation

## 2014-09-11 DIAGNOSIS — R109 Unspecified abdominal pain: Secondary | ICD-10-CM

## 2014-09-11 DIAGNOSIS — Z9889 Other specified postprocedural states: Secondary | ICD-10-CM | POA: Diagnosis not present

## 2014-09-11 DIAGNOSIS — Z720 Tobacco use: Secondary | ICD-10-CM | POA: Diagnosis not present

## 2014-09-11 DIAGNOSIS — E669 Obesity, unspecified: Secondary | ICD-10-CM | POA: Insufficient documentation

## 2014-09-11 LAB — URINE MICROSCOPIC-ADD ON

## 2014-09-11 LAB — URINALYSIS, ROUTINE W REFLEX MICROSCOPIC
Bilirubin Urine: NEGATIVE
Glucose, UA: NEGATIVE mg/dL
LEUKOCYTES UA: NEGATIVE
NITRITE: NEGATIVE
PH: 6 (ref 5.0–8.0)
Protein, ur: NEGATIVE mg/dL
Specific Gravity, Urine: >1.03 — ABNORMAL HIGH (ref 1.005–1.030)
Urobilinogen, UA: 0.2 mg/dL (ref 0.0–1.0)

## 2014-09-11 LAB — PREGNANCY, URINE: PREG TEST UR: NEGATIVE

## 2014-09-11 MED ORDER — SODIUM CHLORIDE 0.9 % IV BOLUS (SEPSIS)
500.0000 mL | Freq: Once | INTRAVENOUS | Status: AC
  Administered 2014-09-11: 500 mL via INTRAVENOUS

## 2014-09-11 MED ORDER — KETOROLAC TROMETHAMINE 30 MG/ML IJ SOLN
30.0000 mg | Freq: Once | INTRAMUSCULAR | Status: AC
  Administered 2014-09-11: 30 mg via INTRAVENOUS
  Filled 2014-09-11: qty 1

## 2014-09-11 MED ORDER — ONDANSETRON HCL 4 MG/2ML IJ SOLN
4.0000 mg | Freq: Once | INTRAMUSCULAR | Status: AC
  Administered 2014-09-11: 4 mg via INTRAVENOUS
  Filled 2014-09-11: qty 2

## 2014-09-11 MED ORDER — TAMSULOSIN HCL 0.4 MG PO CAPS: 0.4000 mg | ORAL_CAPSULE | Freq: Every day | ORAL | Status: DC

## 2014-09-11 MED ORDER — OXYCODONE-ACETAMINOPHEN 5-325 MG PO TABS: 1.0000 | ORAL_TABLET | ORAL | Status: DC | PRN

## 2014-09-11 MED ORDER — PROMETHAZINE HCL 25 MG PO TABS: 25.0000 mg | ORAL_TABLET | Freq: Four times a day (QID) | ORAL | Status: DC | PRN

## 2014-09-11 MED ORDER — HYDROMORPHONE HCL 1 MG/ML IJ SOLN
1.0000 mg | Freq: Once | INTRAMUSCULAR | Status: AC
  Administered 2014-09-11: 1 mg via INTRAVENOUS
  Filled 2014-09-11: qty 1

## 2014-09-11 NOTE — ED Notes (Signed)
Patient is alert and orientated x 4.  She states that her pain has decreased to a "5".   She understood discharge instructions.  She was ambulatory to walk out to car from ED.

## 2014-09-11 NOTE — ED Notes (Signed)
Right flank pain since 830am.

## 2014-09-11 NOTE — Discharge Instructions (Signed)
You have a 4 mm kidney Frisbie. This typically will pass. Education for pain, nausea, and to increase urinary flow. Follow-up with urologist if symptoms persist. Phone number given.

## 2014-09-11 NOTE — ED Provider Notes (Signed)
CSN: [id number]     Arrival date & time 09/11/14  1356 History   First MD Initiated Contact with Patient 09/11/14 1410     Chief Complaint  Patient presents with  . Flank Pain     (Consider location/radiation/quality/duration/timing/severity/associated sxs/prior Treatment) HPI.... Abrupt onset right flank pain a brief time ago. No dysuria, hematuria, vaginal bleeding, vaginal discharge. Negative history of kidney stones. Severity of pain is severe. Nothing makes symptoms better or worse.  Past Medical History  Diagnosis Date  . HTN (hypertension)   . IUD (intrauterine device) in place 03/21/2014  . IUD check up 03/21/2014   Past Surgical History  Procedure Laterality Date  . Cesarean section    . Abscess drainage    . Esophagogastroduodenoscopy N/A 09/08/2013    Procedure: ESOPHAGOGASTRODUODENOSCOPY (EGD);  Surgeon: Corbin Adeobert M Rourk, MD;  Location: [facility];  Service: Endoscopy;  Laterality: N/A;  1:15   Family History  Problem Relation Age of Onset  . Colon cancer Neg Hx   . Fibromyalgia Mother   . Bipolar disorder Mother   . Hypertension Mother   . Obesity Mother   . Hypertension Father   . Other Maternal Grandmother     vertigo  . Diabetes Maternal Grandmother   . Fibromyalgia Maternal Grandmother   . Cancer Maternal Grandfather     melonoma  . Hypertension Paternal Grandmother   . Cancer Paternal Grandfather     brain   History  Substance Use Topics  . Smoking status: Current Some Day Smoker    Types: Cigarettes  . Smokeless tobacco: Never Used  . Alcohol Use: Yes     Comment: socially   OB History    No data available     Review of Systems  All other systems reviewed and are negative.     Allergies  Review of patient's allergies indicates no known allergies.  Home Medications   Prior to Admission medications   Medication Sig Start Date End Date Taking? Authorizing Provider  levonorgestrel (MIRENA) 20 MCG/24HR IUD 1 each by Intrauterine route  once.   Yes Historical Provider, MD  oxyCODONE-acetaminophen (PERCOCET) 5-325 MG per tablet Take 1-2 tablets by mouth every 4 (four) hours as needed. 09/11/14   Donnetta HutchingBrian Shyheem Whitham, MD  promethazine (PHENERGAN) 25 MG tablet Take 1 tablet (25 mg total) by mouth every 6 (six) hours as needed. 09/11/14   Donnetta HutchingBrian Haillee Johann, MD  tamsulosin (FLOMAX) 0.4 MG CAPS capsule Take 1 capsule (0.4 mg total) by mouth daily. 09/11/14   Donnetta HutchingBrian Auburn Hert, MD   BP 136/55 mmHg  Pulse 64  Temp(Src) 97.8 F (36.6 C) (Oral)  Resp 15  Ht 5\' 4"  (1.626 m)  Wt 350 lb (158.759 kg)  BMI 60.05 kg/m2  SpO2 100%  LMP 09/06/2014 Physical Exam  Constitutional: She is oriented to person, place, and time.  Obese, in pain  HENT:  Head: Normocephalic and atraumatic.  Eyes: Conjunctivae and EOM are normal. Pupils are equal, round, and reactive to light.  Neck: Normal range of motion. Neck supple.  Cardiovascular: Normal rate and regular rhythm.   Pulmonary/Chest: Effort normal and breath sounds normal.  Abdominal: Soft. Bowel sounds are normal.  Genitourinary:  Minimal tenderness right flank  Musculoskeletal: Normal range of motion.  Neurological: She is alert and oriented to person, place, and time.  Skin: Skin is warm and dry.  Psychiatric: She has a normal mood and affect. Her behavior is normal.  Nursing note and vitals reviewed.   ED Course  Procedures (  including critical care time) Labs Review Labs Reviewed  URINALYSIS, ROUTINE W REFLEX MICROSCOPIC - Abnormal; Notable for the following:    Specific Gravity, Urine >1.030 (*)    Hgb urine dipstick LARGE (*)    Ketones, ur TRACE (*)    All other components within normal limits  URINE MICROSCOPIC-ADD ON - Abnormal; Notable for the following:    Squamous Epithelial / LPF FEW (*)    Bacteria, UA MANY (*)    All other components within normal limits  PREGNANCY, URINE    Imaging Review Ct Renal Reimers Study  09/11/2014   CLINICAL DATA:  Right flank pain starting this morning  EXAM: CT  ABDOMEN AND PELVIS WITHOUT CONTRAST  TECHNIQUE: Multidetector CT imaging of the abdomen and pelvis was performed following the standard protocol without IV contrast.  COMPARISON:  05/02/2011  FINDINGS: There are streaky artifacts from patient's large body habitus. The lung bases are unremarkable.  Sagittal images of the spine shows degenerative changes lumbar spine. Unenhanced liver is unremarkable. No calcified gallstones are noted within gallbladder. Unenhanced pancreas, spleen and adrenal glands are unremarkable. There is mild right hydronephrosis and right hydroureter. No nephrolithiasis.  In axial image 77 there is 4 mm calcified calculus in distal right ureter about 1 cm from right UVJ.  The urinary bladder is empty limiting its assessment. Pelvic phleboliths are again noted. No calcified left ureteral calculi. There is a mild angulated IUD located in lower uterine segment.  No small bowel obstruction. No ascites or free air. No adenopathy. Normal appendix retrocecal noted in axial image 59. No pericecal inflammation. The terminal ileum is unremarkable.  IMPRESSION: 1. Mild right hydronephrosis and right hydroureter. 2. There is 4 mm calcified obstructive calculus in distal right ureter about 1 cm from right UVJ. 3. Normal appendix.  No pericecal inflammation. 4. Mild angulated IUD with a low position within lower uterine segment. 5. No small bowel obstruction   Electronically Signed   By: Natasha MeadLiviu  Pop M.D.   On: 09/11/2014 16:21     EKG Interpretation None      MDM   Final diagnoses:  Right flank pain  Right kidney Germain    History and physical consistent with kidney Zehren. CT scan shows mild right hydronephrosis and hydroureter along with a 4 mm calculus in the distal right ureter.  Patient feels much better after pain management. I discussed the findings of all tests. She will follow-up with urology as needed.  Discharge medications Percocet, Phenergan 25 mg, Flomax 0.4 mg    Donnetta HutchingBrian Jeffree Cazeau,  MD 09/11/14 [phone]

## 2014-09-14 ENCOUNTER — Encounter (HOSPITAL_COMMUNITY): Payer: Self-pay

## 2014-09-14 ENCOUNTER — Emergency Department (HOSPITAL_COMMUNITY)
Admission: EM | Admit: 2014-09-14 | Discharge: 2014-09-14 | Disposition: A | Discharge status: Home / Self Care | Payer: 59 | Attending: Emergency Medicine | Admitting: Emergency Medicine

## 2014-09-14 DIAGNOSIS — R109 Unspecified abdominal pain: Secondary | ICD-10-CM

## 2014-09-14 DIAGNOSIS — Z79899 Other long term (current) drug therapy: Secondary | ICD-10-CM | POA: Insufficient documentation

## 2014-09-14 DIAGNOSIS — N200 Calculus of kidney: Secondary | ICD-10-CM | POA: Insufficient documentation

## 2014-09-14 DIAGNOSIS — Z720 Tobacco use: Secondary | ICD-10-CM | POA: Diagnosis not present

## 2014-09-14 DIAGNOSIS — I10 Essential (primary) hypertension: Secondary | ICD-10-CM | POA: Diagnosis not present

## 2014-09-14 LAB — URINALYSIS, ROUTINE W REFLEX MICROSCOPIC
GLUCOSE, UA: NEGATIVE mg/dL
Hgb urine dipstick: NEGATIVE
KETONES UR: 15 mg/dL — AB
Leukocytes, UA: NEGATIVE
Nitrite: NEGATIVE
PH: 5.5 (ref 5.0–8.0)
Protein, ur: NEGATIVE mg/dL
SPECIFIC GRAVITY, URINE: 1.029 (ref 1.005–1.030)
Urobilinogen, UA: 0.2 mg/dL (ref 0.0–1.0)

## 2014-09-14 LAB — I-STAT CREATININE, ED: Creatinine, Ser: 1.1 mg/dL — ABNORMAL HIGH (ref 0.44–1.00)

## 2014-09-14 MED ORDER — ONDANSETRON HCL 4 MG/2ML IJ SOLN
4.0000 mg | Freq: Once | INTRAMUSCULAR | Status: AC
  Administered 2014-09-14: 4 mg via INTRAVENOUS

## 2014-09-14 MED ORDER — SODIUM CHLORIDE 0.9 % IV BOLUS (SEPSIS)
1000.0000 mL | Freq: Once | INTRAVENOUS | Status: AC
  Administered 2014-09-14: 1000 mL via INTRAVENOUS

## 2014-09-14 MED ORDER — PROMETHAZINE HCL 25 MG/ML IJ SOLN
25.0000 mg | Freq: Once | INTRAMUSCULAR | Status: AC
  Administered 2014-09-14: 25 mg via INTRAVENOUS
  Filled 2014-09-14: qty 1

## 2014-09-14 MED ORDER — OXYCODONE-ACETAMINOPHEN 5-325 MG PO TABS
2.0000 | ORAL_TABLET | Freq: Once | ORAL | Status: AC
Start: 2014-09-14 — End: 2014-09-14
  Administered 2014-09-14: 2 via ORAL
  Filled 2014-09-14: qty 2

## 2014-09-14 MED ORDER — FENTANYL CITRATE (PF) 100 MCG/2ML IJ SOLN
INTRAMUSCULAR | Status: AC
  Filled 2014-09-14: qty 2

## 2014-09-14 MED ORDER — ONDANSETRON HCL 4 MG/2ML IJ SOLN
INTRAMUSCULAR | Status: AC
  Filled 2014-09-14: qty 2

## 2014-09-14 MED ORDER — FENTANYL CITRATE (PF) 100 MCG/2ML IJ SOLN
50.0000 ug | Freq: Once | INTRAMUSCULAR | Status: AC
  Administered 2014-09-14: 50 ug via INTRAVENOUS

## 2014-09-14 MED ORDER — KETOROLAC TROMETHAMINE 30 MG/ML IJ SOLN
30.0000 mg | Freq: Once | INTRAMUSCULAR | Status: AC
  Administered 2014-09-14: 30 mg via INTRAVENOUS
  Filled 2014-09-14: qty 1

## 2014-09-14 NOTE — Discharge Instructions (Signed)
Kidney Stones °Kidney stones (urolithiasis) are deposits that form inside your kidneys. The intense pain is caused by the Obyrne moving through the urinary tract. When the General moves, the ureter goes into spasm around the Lajeunesse. The Goudeau is usually passed in the urine.  °CAUSES  °· A disorder that makes certain neck glands produce too much parathyroid hormone (primary hyperparathyroidism). °· A buildup of uric acid crystals, similar to gout in your joints. °· Narrowing (stricture) of the ureter. °· A kidney obstruction present at birth (congenital obstruction). °· Previous surgery on the kidney or ureters. °· Numerous kidney infections. °SYMPTOMS  °· Feeling sick to your stomach (nauseous). °· Throwing up (vomiting). °· Blood in the urine (hematuria). °· Pain that usually spreads (radiates) to the groin. °· Frequency or urgency of urination. °DIAGNOSIS  °· Taking a history and physical exam. °· Blood or urine tests. °· CT scan. °· Occasionally, an examination of the inside of the urinary bladder (cystoscopy) is performed. °TREATMENT  °· Observation. °· Increasing your fluid intake. °· Extracorporeal shock wave lithotripsy--This is a noninvasive procedure that uses shock waves to break up kidney stones. °· Surgery may be needed if you have severe pain or persistent obstruction. There are various surgical procedures. Most of the procedures are performed with the use of small instruments. Only small incisions are needed to accommodate these instruments, so recovery time is minimized. °The size, location, and chemical composition are all important variables that will determine the proper choice of action for you. Talk to your health care provider to better understand your situation so that you will minimize the risk of injury to yourself and your kidney.  °HOME CARE INSTRUCTIONS  °· Drink enough water and fluids to keep your urine clear or pale yellow. This will help you to pass the Weismann or Royle fragments. °· Strain  all urine through the provided strainer. Keep all particulate matter and stones for your health care provider to see. The Groner causing the pain may be as small as a grain of salt. It is very important to use the strainer each and every time you pass your urine. The collection of your Oriordan will allow your health care provider to analyze it and verify that a Montano has actually passed. The Lesser analysis will often identify what you can do to reduce the incidence of recurrences. °· Only take over-the-counter or prescription medicines for pain, discomfort, or fever as directed by your health care provider. °· Make a follow-up appointment with your health care provider as directed. °· Get follow-up X-rays if required. The absence of pain does not always mean that the Manganelli has passed. It may have only stopped moving. If the urine remains completely obstructed, it can cause loss of kidney function or even complete destruction of the kidney. It is your responsibility to make sure X-rays and follow-ups are completed. Ultrasounds of the kidney can show blockages and the status of the kidney. Ultrasounds are not associated with any radiation and can be performed easily in a matter of minutes. °SEEK MEDICAL CARE IF: °· You experience pain that is progressive and unresponsive to any pain medicine you have been prescribed. °SEEK IMMEDIATE MEDICAL CARE IF:  °· Pain cannot be controlled with the prescribed medicine. °· You have a fever or shaking chills. °· The severity or intensity of pain increases over 18 hours and is not relieved by pain medicine. °· You develop a new onset of abdominal pain. °· You feel faint or pass out. °·   You are unable to urinate. °MAKE SURE YOU:  °· Understand these instructions. °· Will watch your condition. °· Will get help right away if you are not doing well or get worse. °Document Released: 04/21/2005 Document Revised: 12/22/2012 Document Reviewed: 09/22/2012 °ExitCare® Patient Information ©2015  [employer]. This information is not intended to replace advice given to you by your health care provider. Make sure you discuss any questions you have with your health care provider. ° °

## 2014-09-14 NOTE — ED Provider Notes (Signed)
CSN: [id number]     Arrival date & time 09/14/14  1739 History   First MD Initiated Contact with Patient 09/14/14 1829     Chief Complaint  Patient presents with  . Flank Pain     (Consider location/radiation/quality/duration/timing/severity/associated sxs/prior Treatment) HPI   29 year old female with no prior history of kidney stones, recently seen at [facility] 3 days ago with right flank pain. CT scan showed evidence of a 4 mm Stalvey at the distal right ureter with mild right hydronephrosis. Her pain was treated in the ER and patient was discharged with Percocet, Phenergan, and Flomax. She was told to follow-up with urology as needed. Patient states the pain was mostly under control until last night which it wakes her up. Describe sharp stabbing colicky pain to her right flank that radiates to her right groin. Pain is been waxing waning, currently 8 out of 10 with associated nausea and vomiting. She denies hematuria but does complain of some urinary discomfort. She denies fever, chills, or rash.  Past Medical History  Diagnosis Date  . HTN (hypertension)   . IUD (intrauterine device) in place 03/21/2014  . IUD check up 03/21/2014   Past Surgical History  Procedure Laterality Date  . Cesarean section    . Abscess drainage    . Esophagogastroduodenoscopy N/A 09/08/2013    Procedure: ESOPHAGOGASTRODUODENOSCOPY (EGD);  Surgeon: Corbin Adeobert M Rourk, MD;  Location: [facility];  Service: Endoscopy;  Laterality: N/A;  1:15   Family History  Problem Relation Age of Onset  . Colon cancer Neg Hx   . Fibromyalgia Mother   . Bipolar disorder Mother   . Hypertension Mother   . Obesity Mother   . Hypertension Father   . Other Maternal Grandmother     vertigo  . Diabetes Maternal Grandmother   . Fibromyalgia Maternal Grandmother   . Cancer Maternal Grandfather     melonoma  . Hypertension Paternal Grandmother   . Cancer Paternal Grandfather     brain   History  Substance Use Topics   . Smoking status: Current Some Day Smoker    Types: Cigarettes  . Smokeless tobacco: Never Used  . Alcohol Use: Yes     Comment: socially   OB History    No data available     Review of Systems  All other systems reviewed and are negative.     Allergies  Review of patient's allergies indicates no known allergies.  Home Medications   Prior to Admission medications   Medication Sig Start Date End Date Taking? Authorizing Provider  levonorgestrel (MIRENA) 20 MCG/24HR IUD 1 each by Intrauterine route once.   Yes Historical Provider, MD  oxyCODONE-acetaminophen (PERCOCET) 5-325 MG per tablet Take 1-2 tablets by mouth every 4 (four) hours as needed. 09/11/14  Yes Donnetta HutchingBrian Cook, MD  promethazine (PHENERGAN) 25 MG tablet Take 1 tablet (25 mg total) by mouth every 6 (six) hours as needed. 09/11/14  Yes Donnetta HutchingBrian Cook, MD  tamsulosin (FLOMAX) 0.4 MG CAPS capsule Take 1 capsule (0.4 mg total) by mouth daily. 09/11/14  Yes Donnetta HutchingBrian Cook, MD   BP 134/82 mmHg  Pulse 73  Temp(Src) 97.8 F (36.6 C) (Oral)  Resp 22  Ht 5\' 4"  (1.626 m)  Wt 350 lb (158.759 kg)  BMI 60.05 kg/m2  SpO2 100%  LMP 09/06/2014 Physical Exam  Constitutional: She appears well-developed and well-nourished. No distress.  Morbidly obese Caucasian female, sitting upright, actively vomiting.  HENT:  Head: Atraumatic.  Mouth/Throat: Oropharynx is  clear and moist.  Eyes: Conjunctivae are normal.  Neck: Neck supple.  Cardiovascular: Normal rate and regular rhythm.   Pulmonary/Chest: Effort normal and breath sounds normal.  Abdominal: Soft. There is no tenderness.  Genitourinary:  No CVA tenderness.  Neurological: She is alert.  Skin: No rash noted.  Psychiatric: She has a normal mood and affect.  Nursing note and vitals reviewed.   ED Course  Procedures (including critical care time)  Presents with worsening right flank pain, recently diagnosed with kidney Mcinturff on the right side. Anticipate her pain is related to her  kidney Schweigert. Will provide pain management and antinausea medication. I will recheck UA to ensure no evidence of UTI that can complicate her condition.  9:33 PM Creatinine is 1.1. IV fluid given. UA shows no evidence of urinary tract infection. After receiving pain medication, patient felt better. Anticipate discharge with outpatient follow-up with urologist for further management. Patient does have pain medication at home.  Labs Review Labs Reviewed  URINALYSIS, ROUTINE W REFLEX MICROSCOPIC - Abnormal; Notable for the following:    Color, Urine AMBER (*)    APPearance CLOUDY (*)    Bilirubin Urine SMALL (*)    Ketones, ur 15 (*)    All other components within normal limits  I-STAT CREATININE, ED - Abnormal; Notable for the following:    Creatinine, Ser 1.10 (*)    All other components within normal limits    Imaging Review No results found.   EKG Interpretation None      MDM   Final diagnoses:  Right flank pain  Kidney Norenberg on right side    BP 126/74 mmHg  Pulse 59  Temp(Src) 97.8 F (36.6 C) (Oral)  Resp 22  Ht 5\' 4"  (1.626 m)  Wt 350 lb (158.759 kg)  BMI 60.05 kg/m2  SpO2 100%  LMP 09/06/2014     Fayrene HelperBowie Chalyn Amescua, PA-C 09/14/14 2229  Linwood DibblesJon Knapp, MD 09/14/14 2245

## 2014-09-14 NOTE — ED Notes (Signed)
Pt was at [facility] for kidney Marzano on 5/9 and today started having right flank pain today at 1300. Hasn't passed it yet. Has been drinking a lot of water and worked last night and is suppose to work [employer] but isn't going to be able to. Nausea and vomiting.

## 2014-09-14 NOTE — ED Notes (Signed)
Pt is in stable condition upon d/c and ambulates from ED. 

## 2014-09-14 NOTE — ED Notes (Signed)
Tried to urinate. Not successful, says it feels like it's blocked will trylater

## 2015-07-26 IMAGING — US US ABDOMEN COMPLETE
1 series · 14 of 25 positions shown · non-contrast
Comparison: Radiographs dated 09/02/2013.

CLINICAL DATA: Right upper quadrant abdominal pain nausea and
vomiting.

EXAM:
ULTRASOUND ABDOMEN COMPLETE

[Series 1: us abdomen complete · 0.26mm/px · 14 of 86 slices shown]
[im 1/86]
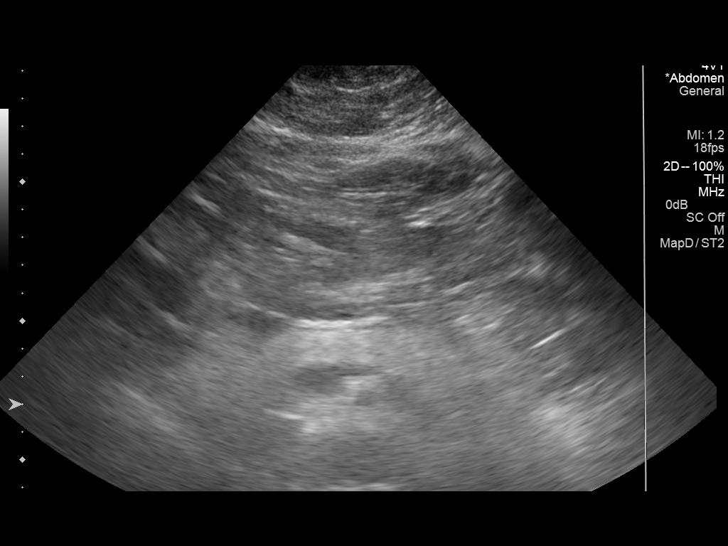
[im 8/86]
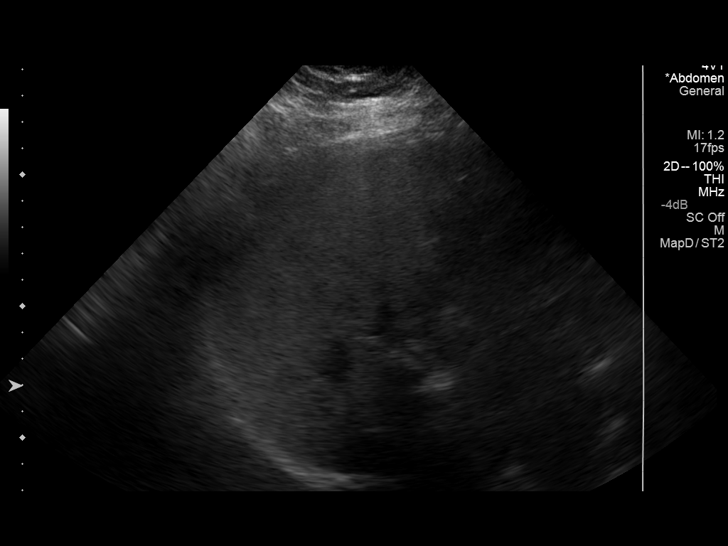
[im 15/86]
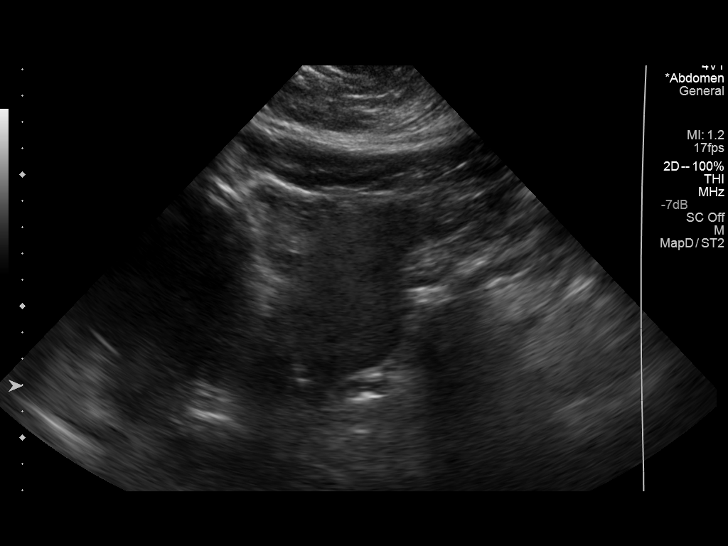
[im 22/86]
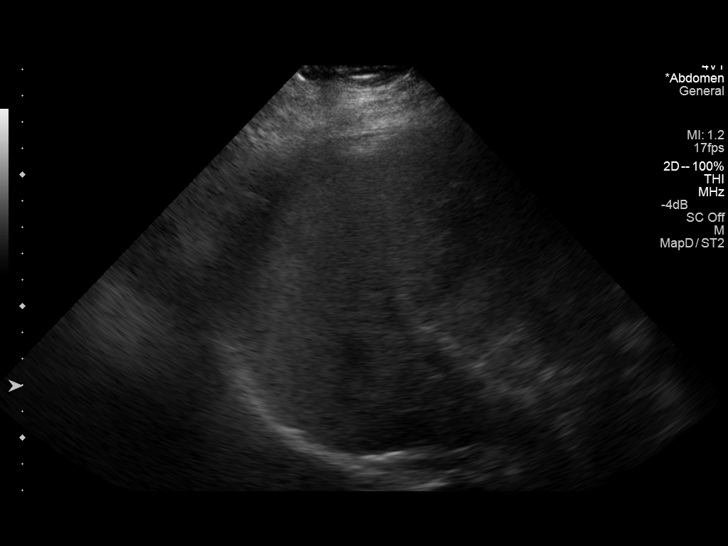
[im 29/86]
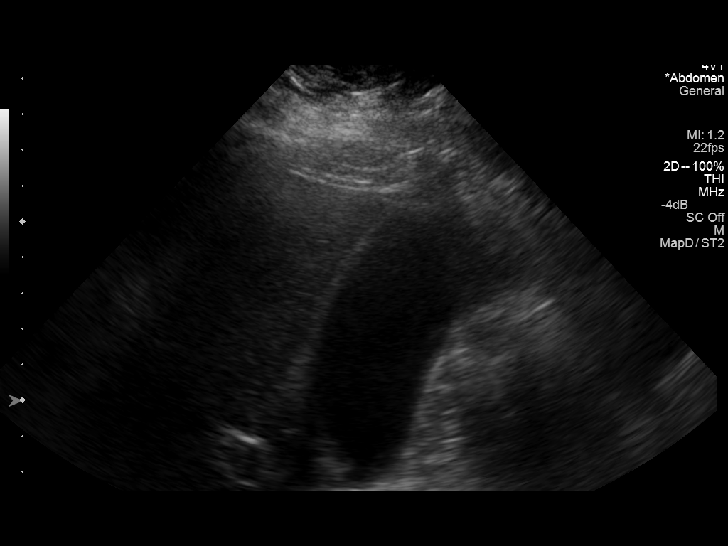
[im 32/86]
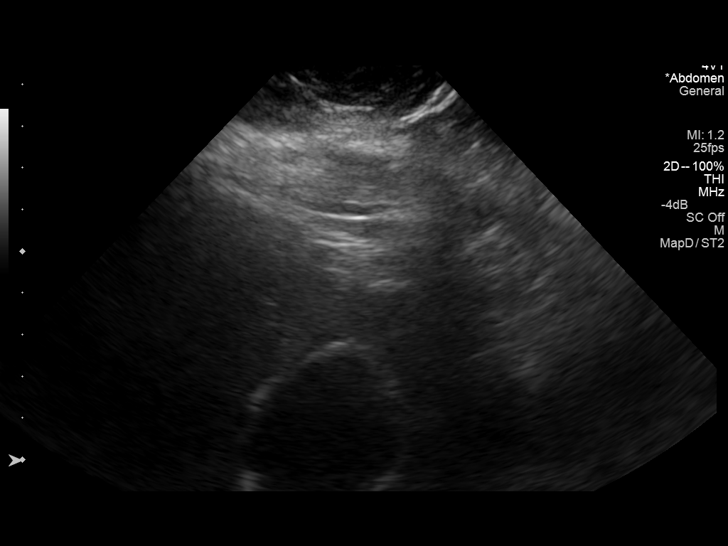
[im 39/86]
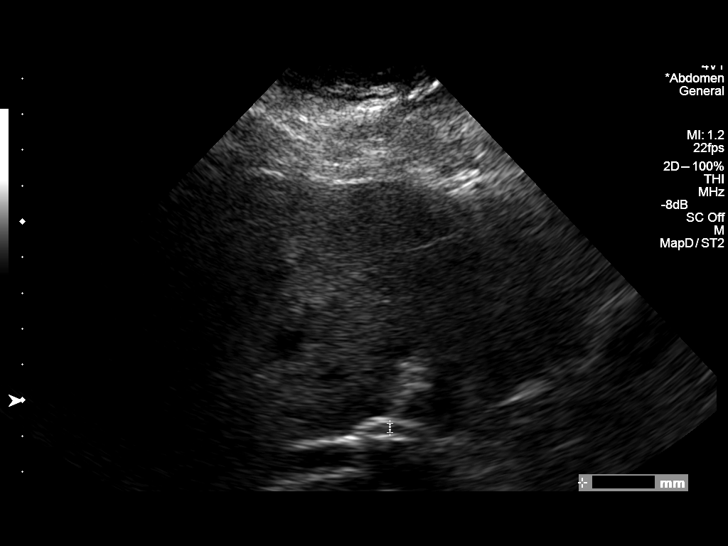
[im 47/86]
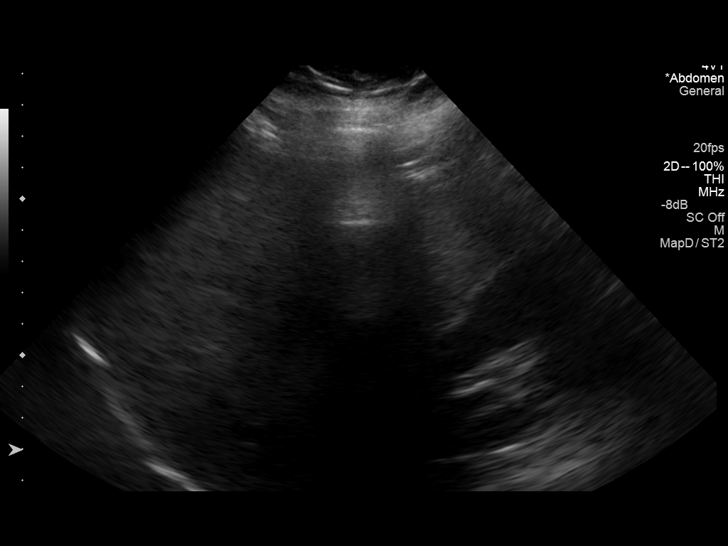
[im 54/86]
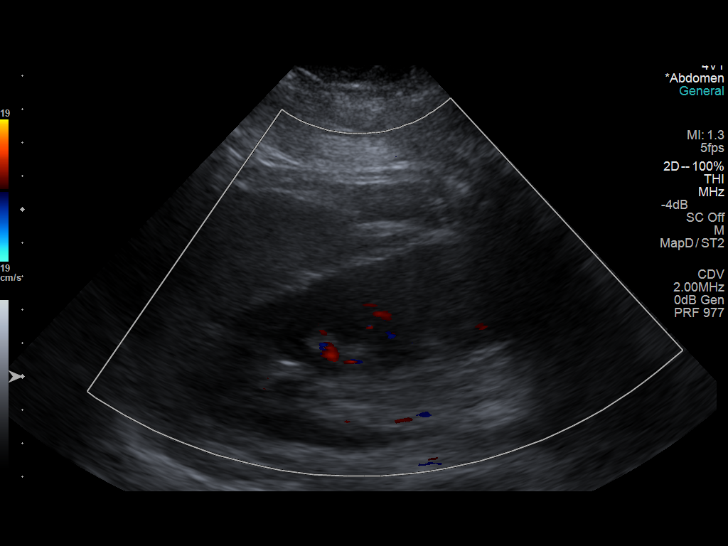
[im 57/86]
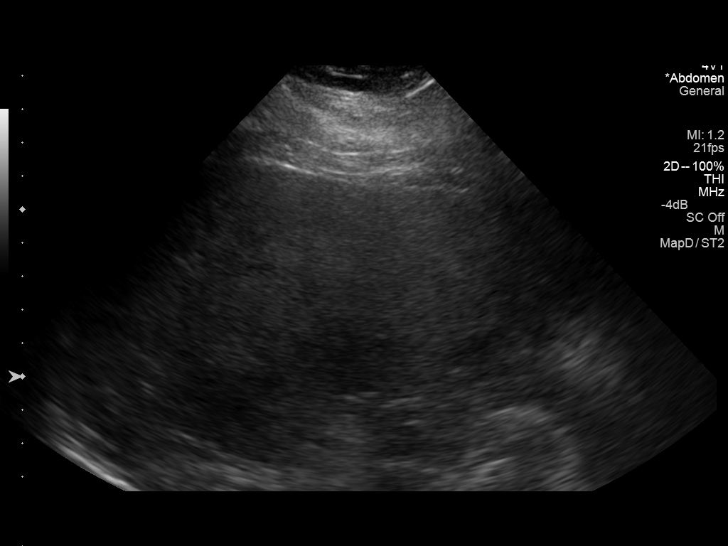
[im 64/86]
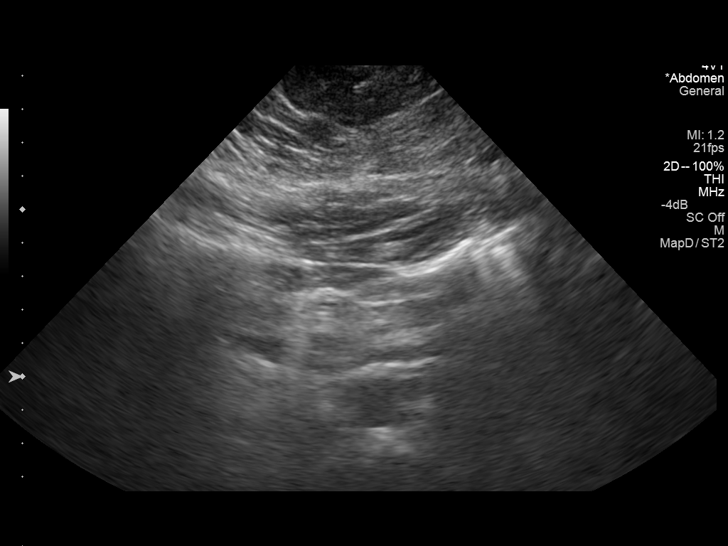
[im 71/86]
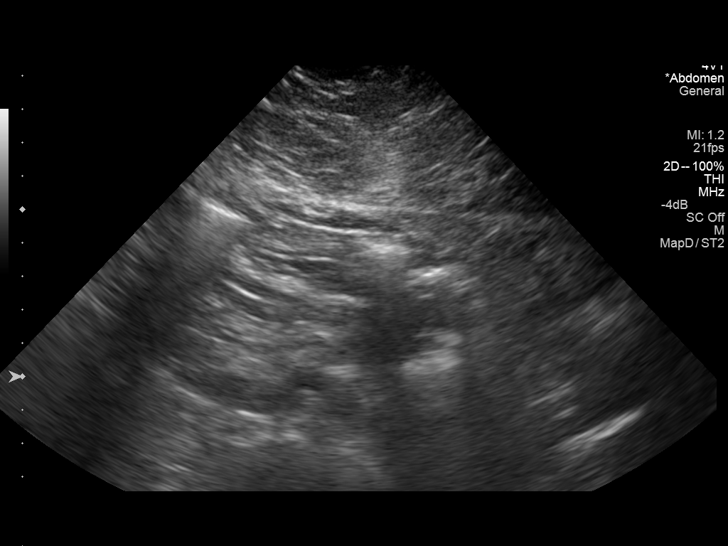
[im 78/86]
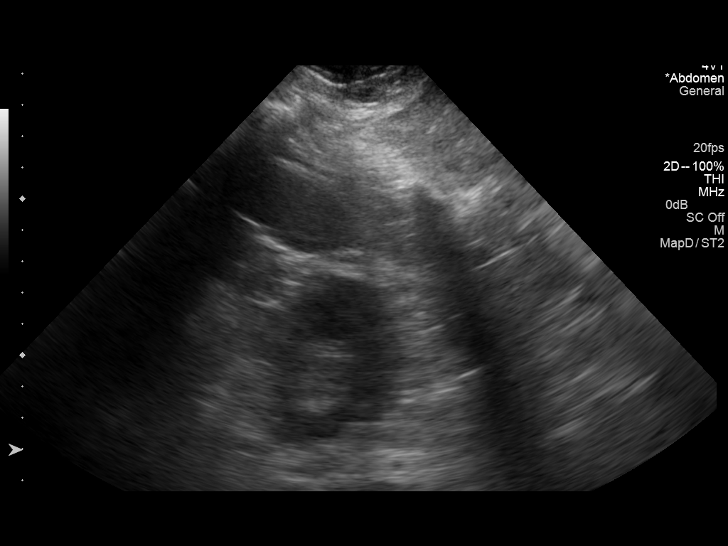
[im 86/86]
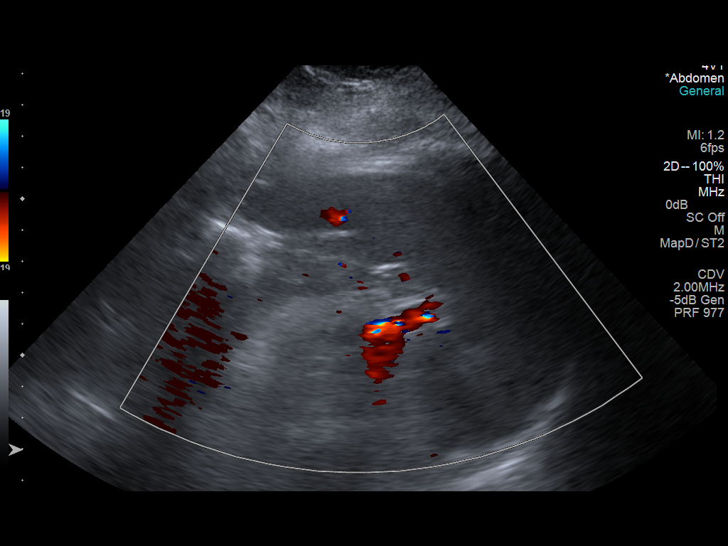

[14 of 25 positions shown; findings below may reference images not displayed]

FINDINGS: Gallbladder:

No gallstones or wall thickening visualized. No sonographic Murphy
sign noted.

Common bile duct:

Diameter: 2.6 mm

Liver:

Diffusely echogenic.

IVC:

No abnormality visualized.

Pancreas:

Visualized portion unremarkable.

Spleen:

Size and appearance within normal limits.

Right Kidney:

Length: 12.4 cm. Echogenicity within normal limits. No mass or
hydronephrosis visualized.

Left Kidney:

Length: 11.9 cm. Echogenicity within normal limits. No mass or
hydronephrosis visualized.

Abdominal aorta:

No aneurysm visualized.

Other findings:

None.
IMPRESSION: Diffusely echogenic liver, most likely due to steatosis. Otherwise,
normal examination.

## 2015-07-26 IMAGING — CR DG ABDOMEN ACUTE W/ 1V CHEST
4 series · 4 of 4 positions shown · non-contrast
Comparison: None.

ADDENDUM:
The impression should read as follows: No acute abnormality.
CLINICAL DATA: Upper abdominal pain and vomiting.

EXAM:
ACUTE ABDOMEN SERIES (ABDOMEN 2 VIEW & CHEST 1 VIEW)

[view not recorded (1 of 4)]
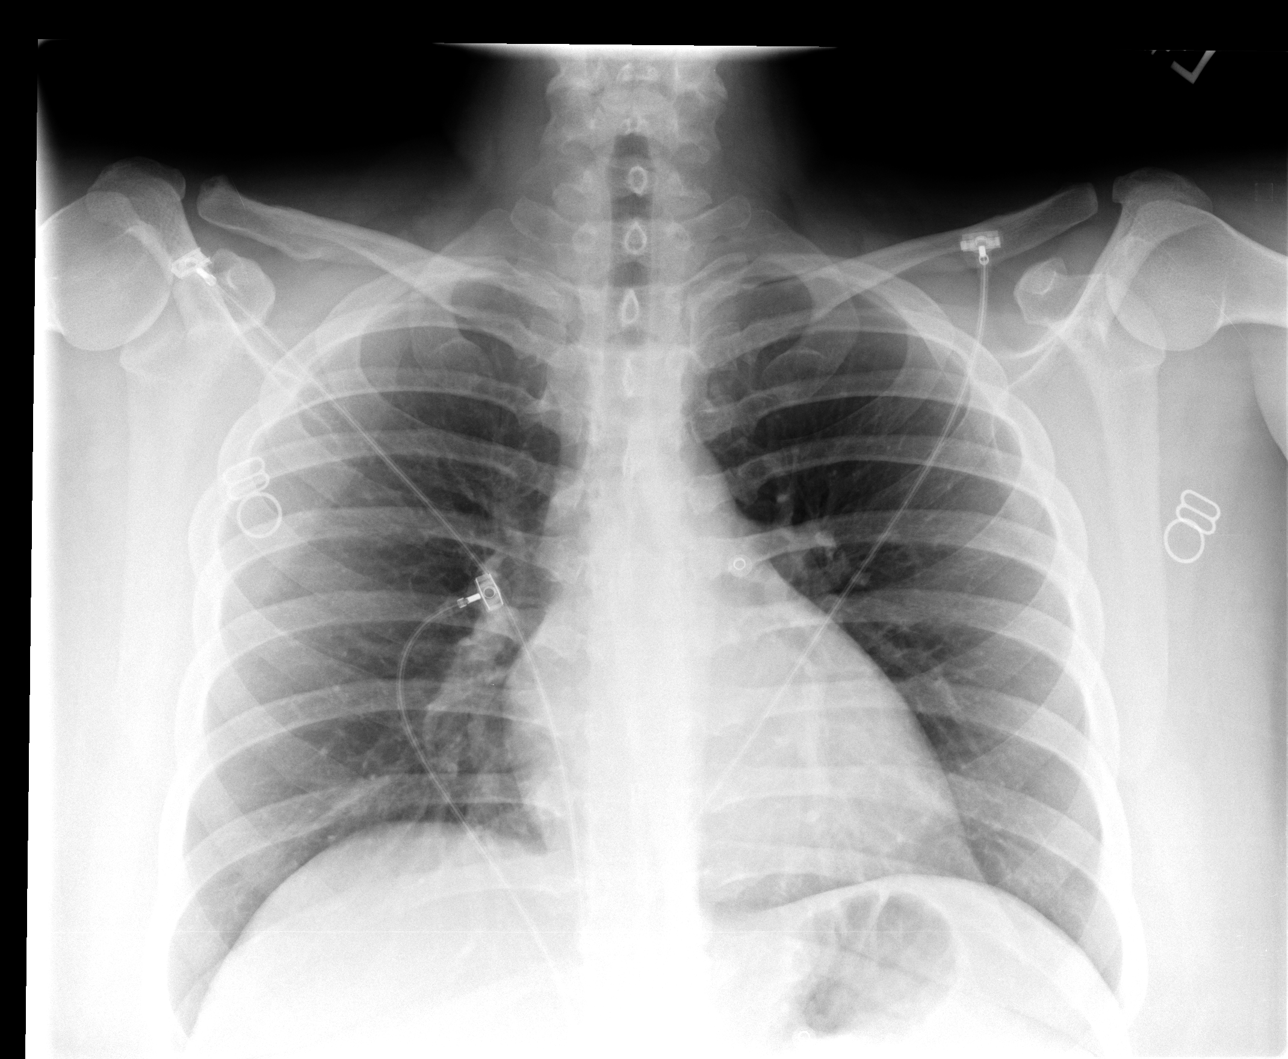

[view not recorded (2 of 4)]
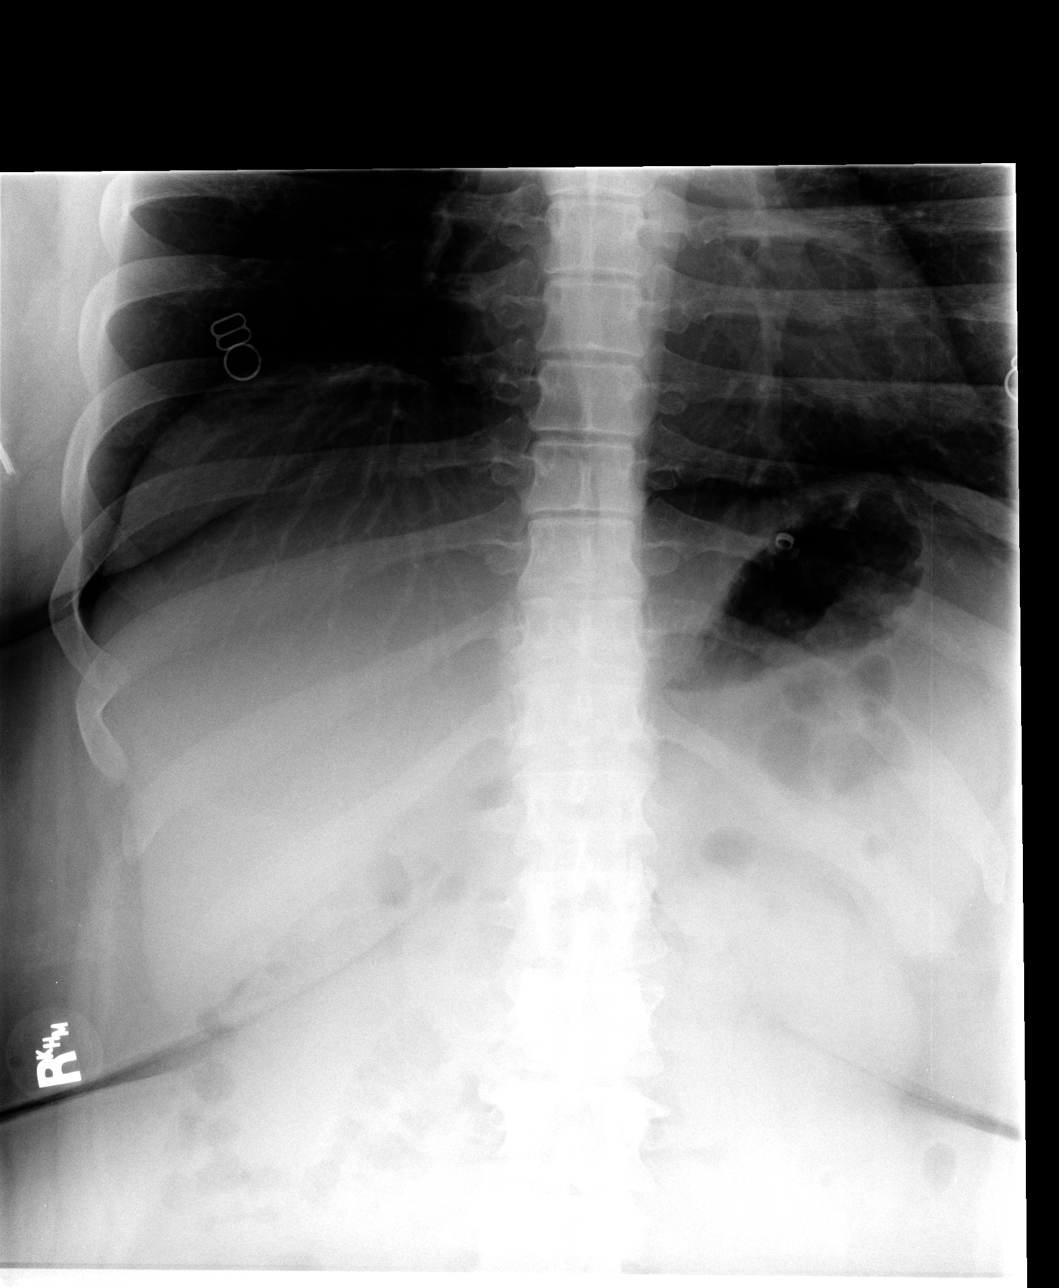

[view not recorded (3 of 4)]
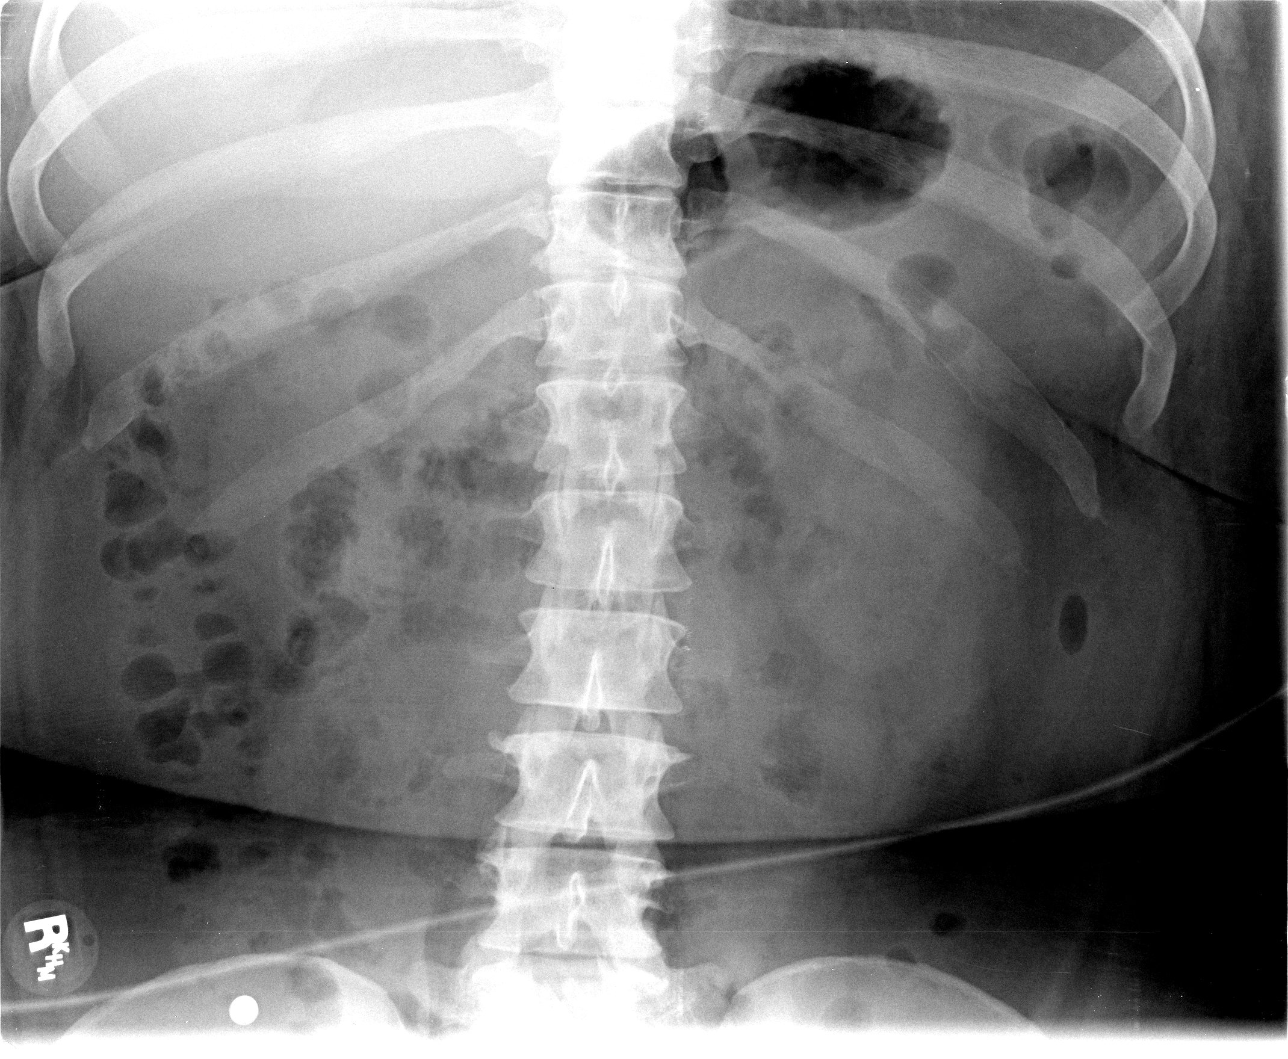

[view not recorded (4 of 4)]
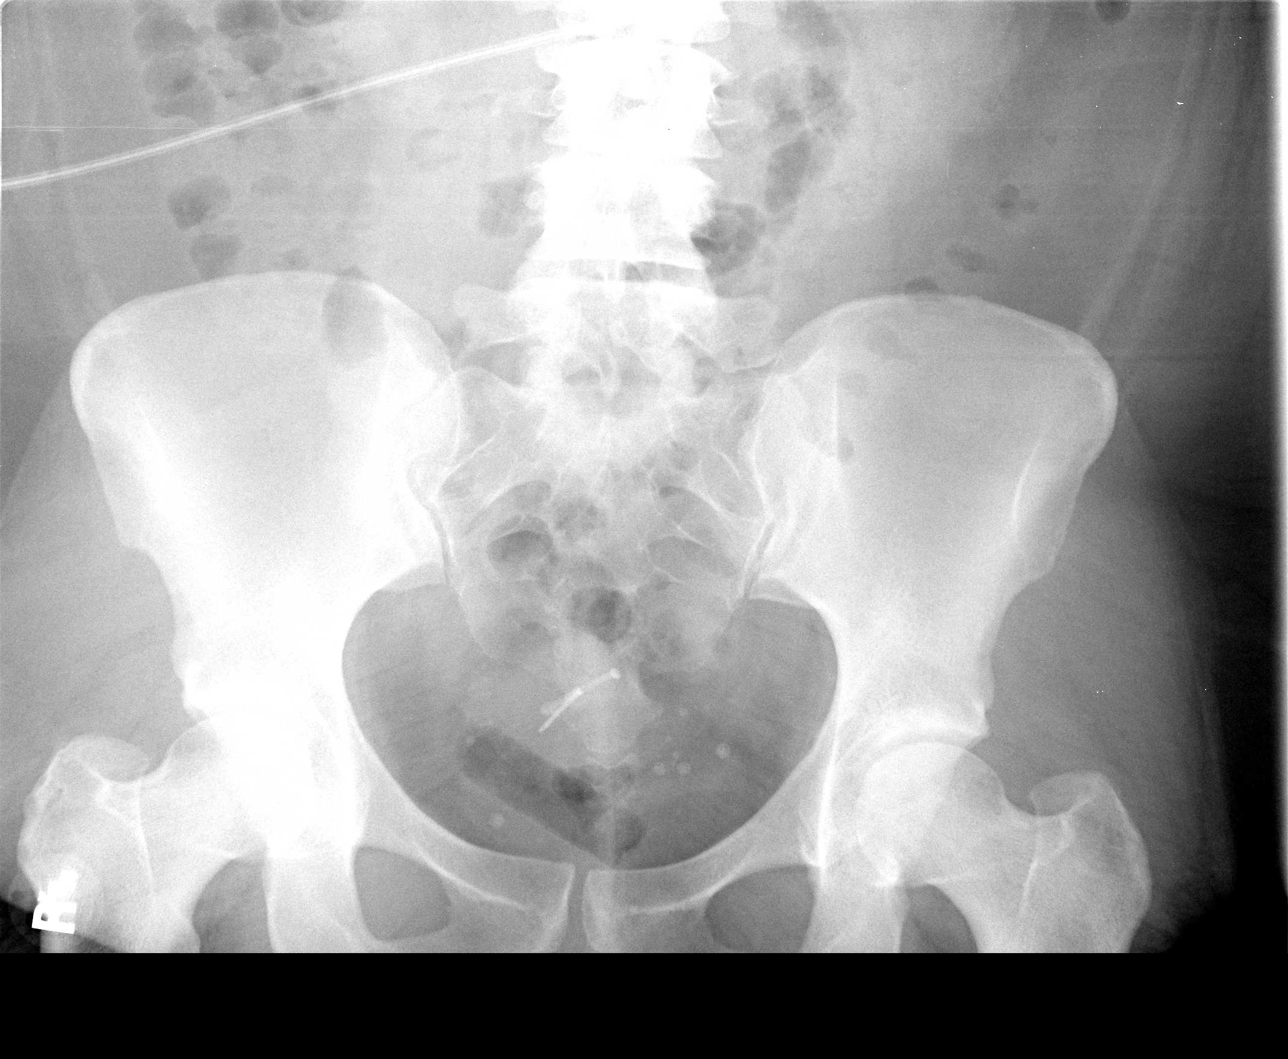

[4 of 4 positions shown; findings below may reference images not displayed]

FINDINGS: Normal sized heart. Clear lungs. Normal bowel gas pattern.
Intrauterine device in the central pelvis. Lumbar and thoracic spine
degenerative changes.
IMPRESSION: Acute abnormality.

## 2016-03-20 ENCOUNTER — Other Ambulatory Visit: Payer: Self-pay | Admitting: Obstetrics and Gynecology

## 2016-03-20 ENCOUNTER — Ambulatory Visit (INDEPENDENT_AMBULATORY_CARE_PROVIDER_SITE_OTHER): Payer: 59 | Admitting: Urgent Care

## 2016-03-20 VITALS — BP 148/88 | HR 81 | Temp 98.1°F | Resp 20 | Ht 64.0 in | Wt 370.0 lb

## 2016-03-20 DIAGNOSIS — R102 Pelvic and perineal pain: Secondary | ICD-10-CM

## 2016-03-20 DIAGNOSIS — M545 Low back pain, unspecified: Secondary | ICD-10-CM

## 2016-03-20 DIAGNOSIS — Z975 Presence of (intrauterine) contraceptive device: Secondary | ICD-10-CM | POA: Diagnosis not present

## 2016-03-20 LAB — POCT CBC
GRANULOCYTE PERCENT: 70.3 % (ref 37–80)
HEMATOCRIT: 38.8 % (ref 37.7–47.9)
Hemoglobin: 13.5 g/dL (ref 12.2–16.2)
Lymph, poc: 2.4 (ref 0.6–3.4)
MCH, POC: 30.8 pg (ref 27–31.2)
MCHC: 34.9 g/dL (ref 31.8–35.4)
MCV: 88.3 fL (ref 80–97)
MID (CBC): 0.4 (ref 0–0.9)
MPV: 7.8 fL (ref 0–99.8)
POC GRANULOCYTE: 6.5 (ref 2–6.9)
POC LYMPH %: 25.8 % (ref 10–50)
POC MID %: 3.9 % (ref 0–12)
Platelet Count, POC: 242 10*3/uL (ref 142–424)
RBC: 4.4 M/uL (ref 4.04–5.48)
RDW, POC: 12.5 %
WBC: 9.2 10*3/uL (ref 4.6–10.2)

## 2016-03-20 LAB — POCT WET + KOH PREP
Trich by wet prep: ABSENT
Yeast by KOH: ABSENT
Yeast by wet prep: ABSENT

## 2016-03-20 LAB — POCT URINALYSIS DIP (MANUAL ENTRY)
BILIRUBIN UA: NEGATIVE
GLUCOSE UA: NEGATIVE
Leukocytes, UA: NEGATIVE
NITRITE UA: NEGATIVE
Protein Ur, POC: NEGATIVE
Spec Grav, UA: >=1.03
Urobilinogen, UA: 0.2
pH, UA: 5.5

## 2016-03-20 LAB — POCT URINE PREGNANCY: PREG TEST UR: NEGATIVE

## 2016-03-20 LAB — POC MICROSCOPIC URINALYSIS (UMFC): MUCUS RE: ABSENT

## 2016-03-20 LAB — HIV ANTIBODY (ROUTINE TESTING W REFLEX): HIV 1&2 Ab, 4th Generation: NONREACTIVE

## 2016-03-20 MED ORDER — NAPROXEN SODIUM 550 MG PO TABS: 550.0000 mg | ORAL_TABLET | Freq: Two times a day (BID) | ORAL | 1 refills | Status: DC

## 2016-03-20 NOTE — Patient Instructions (Addendum)
Pelvic Pain, Female Female pelvic pain can be caused by many different things and start from a variety of places. Pelvic pain refers to pain that is located in the lower half of the abdomen and between your hips. The pain may occur over a short period of time (acute) or may be reoccurring (chronic). The cause of pelvic pain may be related to disorders affecting the female reproductive organs (gynecologic), but it may also be related to the bladder, kidney stones, an intestinal complication, or muscle or skeletal problems. Getting help right away for pelvic pain is important, especially if there has been severe, sharp, or a sudden onset of unusual pain. It is also important to get help right away because some types of pelvic pain can be life threatening.  CAUSES  Below are only some of the causes of pelvic pain. The causes of pelvic pain can be in one of several categories.   Gynecologic.  Pelvic inflammatory disease.  Sexually transmitted infection.  Ovarian cyst or a twisted ovarian ligament (ovarian torsion).  Uterine lining that grows outside the uterus (endometriosis).  Fibroids, cysts, or tumors.  Ovulation.  Pregnancy.  Pregnancy that occurs outside the uterus (ectopic pregnancy).  Miscarriage.  Labor.  Abruption of the placenta or ruptured uterus.  Infection.  Uterine infection (endometritis).  Bladder infection.  Diverticulitis.  Miscarriage related to a uterine infection (septic abortion).  Bladder.  Inflammation of the bladder (cystitis).  Kidney Kranz(s).  Gastrointestinal.  Constipation.  Diverticulitis.  Neurologic.  Trauma.  Feeling pelvic pain because of mental or emotional causes (psychosomatic).  Cancers of the bowel or pelvis. EVALUATION  Your caregiver will want to take a careful history of your concerns. This includes recent changes in your health, a careful gynecologic history of your periods (menses), and a sexual history. Obtaining  your family history and medical history is also important. Your caregiver may suggest a pelvic exam. A pelvic exam will help identify the location and severity of the pain. It also helps in the evaluation of which organ system may be involved. In order to identify the cause of the pelvic pain and be properly treated, your caregiver may order tests. These tests may include:   A pregnancy test.  Pelvic ultrasonography.  An X-ray exam of the abdomen.  A urinalysis or evaluation of vaginal discharge.  Blood tests. HOME CARE INSTRUCTIONS   Only take over-the-counter or prescription medicines for pain, discomfort, or fever as directed by your caregiver.   Rest as directed by your caregiver.   Eat a balanced diet.   Drink enough fluids to make your urine clear or pale yellow, or as directed.   Avoid sexual intercourse if it causes pain.   Apply warm or cold compresses to the lower abdomen depending on which one helps the pain.   Avoid stressful situations.   Keep a journal of your pelvic pain. Write down when it started, where the pain is located, and if there are things that seem to be associated with the pain, such as food or your menstrual cycle.  Follow up with your caregiver as directed.  SEEK MEDICAL CARE IF:  Your medicine does not help your pain.  You have abnormal vaginal discharge. SEEK IMMEDIATE MEDICAL CARE IF:   You have heavy bleeding from the vagina.   Your pelvic pain increases.   You feel light-headed or faint.   You have chills.   You have pain with urination or blood in your urine.   You have uncontrolled  diarrhea or vomiting.   You have a fever or persistent symptoms for more than 3 days.  You have a fever and your symptoms suddenly get worse.   You are being physically or sexually abused. This information is not intended to replace advice given to you by your health care provider. Make sure you discuss any questions you have with your  health care provider. Document Released: 03/18/2004 Document Revised: 01/10/2015 Document Reviewed: 02/09/2015 Elsevier Interactive Patient Education  2017 [employer]     IF you received an x-ray today, you will receive an invoice from [facility]. Please contact [facility] at [phone] with questions or concerns regarding your invoice.   IF you received labwork today, you will receive an invoice from [facility]. Please contact [facility] at [phone] with questions or concerns regarding your invoice.   Our billing staff will not be able to assist you with questions regarding bills from these companies.  You will be contacted with the lab results as soon as they are available. The fastest way to get your results is to activate your My Chart account. Instructions are located on the last page of this paperwork. If you have not heard from us regarding the results in 2 weeks, please contact this office.

## 2016-03-20 NOTE — Progress Notes (Signed)
MRN: [id number] DOB: 05/01/1986  Subjective:   Jamie Aguilar is a 30 y.o. female presenting for chief complaint of Back Pain (x 4-5 days) and Pelvic Pain  Reports 5 day history of pelvic cramping, low back pain. Also has had light vaginal bleeding, intermittent headaches. LMP was 11/02-07/20107, regular cycle. Patient had IUD placed ~5 years ago, plans on having this removed soon. Has tried ibuprofen 800mg  with minimal relief. Denies fever, n/v, abdominal pain, vaginal discharge, genital rashes, dysuria, hematuria, urinary frequency, flank pain, falls, trauma. She is sexually active, has had vigorous sex in the past couple of weeks. She has one partner and does not use condoms for protection.  Jamie Aguilar has a current medication list which includes the following prescription(s): levonorgestrel. Also has No Known Allergies.  Jamie Aguilar  has a past medical history of HTN (hypertension); IUD (intrauterine device) in place (03/21/2014); and IUD check up (03/21/2014). Also  has a past surgical history that includes Cesarean section; Abscess drainage; and Esophagogastroduodenoscopy (N/A, 09/08/2013).   Objective:   Vitals: BP (!) 148/88 (BP Location: Right Arm, Patient Position: Sitting, Cuff Size: Large)   Pulse 81   Temp 98.1 F (36.7 C) (Oral)   Resp 20   Ht 5\' 4"  (1.626 m)   Wt (!) 370 lb (167.8 kg)   LMP 03/06/2016   SpO2 97%   BMI 63.51 kg/m   Physical Exam  Constitutional: She is oriented to person, place, and time. She appears well-developed and well-nourished.  Body habitus is morbidly obese.  Cardiovascular: Normal rate, regular rhythm and intact distal pulses.  Exam reveals no gallop and no friction rub.   No murmur heard. Pulmonary/Chest: No respiratory distress. She has no wheezes. She has no rales.  Abdominal: Soft. Bowel sounds are normal. She exhibits no distension and no mass. There is no guarding.  Genitourinary: There is no rash, tenderness or lesion on the right  labia. There is no rash, tenderness or lesion on the left labia. Uterus is not deviated, not enlarged, not fixed and not tender. Cervix exhibits discharge (brown, bloody). Cervix exhibits no motion tenderness and no friability. Right adnexum displays tenderness. Right adnexum displays no mass and no fullness. Left adnexum displays tenderness. Left adnexum displays no mass and no fullness. No erythema, tenderness or bleeding in the vagina. No foreign body in the vagina. No vaginal discharge found.  Lymphadenopathy:       Right: No inguinal adenopathy present.       Left: No inguinal adenopathy present.  Neurological: She is alert and oriented to person, place, and time.  Skin: Skin is warm and dry.   Results for orders placed or performed in visit on 03/20/16 (from the past 24 hour(s))  POCT CBC     Status: None   Collection Time: 03/20/16  9:02 AM  Result Value Ref Range   WBC 9.2 4.6 - 10.2 K/uL   Lymph, poc 2.4 0.6 - 3.4   POC LYMPH PERCENT 25.8 10 - 50 %L   MID (cbc) 0.4 0 - 0.9   POC MID % 3.9 0 - 12 %M   POC Granulocyte 6.5 2 - 6.9   Granulocyte percent 70.3 37 - 80 %G   RBC 4.40 4.04 - 5.48 M/uL   Hemoglobin 13.5 12.2 - 16.2 g/dL   HCT, POC 13.238.8 44.037.7 - 47.9 %   MCV 88.3 80 - 97 fL   MCH, POC 30.8 27 - 31.2 pg   MCHC 34.9 31.8 - 35.4 g/dL  RDW, POC 12.5 %   Platelet Count, POC 242 142 - 424 K/uL   MPV 7.8 0 - 99.8 fL  POCT urinalysis dipstick     Status: Abnormal   Collection Time: 03/20/16  9:05 AM  Result Value Ref Range   Color, UA yellow yellow   Clarity, UA cloudy (A) clear   Glucose, UA negative negative   Bilirubin, UA negative negative   Ketones, POC UA trace (5) (A) negative   Spec Grav, UA >=1.030    Blood, UA large (A) negative   pH, UA 5.5    Protein Ur, POC negative negative   Urobilinogen, UA 0.2    Nitrite, UA Negative Negative   Leukocytes, UA Negative Negative  POCT Wet + KOH Prep     Status: Abnormal   Collection Time: 03/20/16  9:05 AM  Result  Value Ref Range   Yeast by KOH Absent Present, Absent   Yeast by wet prep Absent Present, Absent   WBC by wet prep Few None, Few, Too numerous to count   Clue Cells Wet Prep HPF POC None None, Too numerous to count   Trich by wet prep Absent Present, Absent   Bacteria Wet Prep HPF POC Few None, Few, Too numerous to count   Epithelial Cells By [employer]et Pref ([facility]) Moderate (A) None, Few, Too numerous to count   RBC,UR,HPF,POC Few (A) None RBC/hpf  POCT urine pregnancy     Status: None   Collection Time: 03/20/16  9:06 AM  Result Value Ref Range   Preg Test, Ur Negative Negative  POCT Microscopic Urinalysis ([facility])     Status: Abnormal   Collection Time: 03/20/16  9:07 AM  Result Value Ref Range   WBC,UR,HPF,POC None None WBC/hpf   RBC,UR,HPF,POC Few (A) None RBC/hpf   Bacteria None None, Too numerous to count   Mucus Absent Absent   Epithelial Cells, UR Per Microscopy Moderate (A) None, Too numerous to count cells/hpf   Assessment and Plan :   1. Acute pelvic pain, female 2. IUD (intrauterine device) in place 3. Acute midline low back pain without sciatica - Patient declined to have US referral through our clinic because she lives in [place]. States that she will schedule this today with a practice that knows her well. I discussed the possibility of having IUD issue, ovarian cyst or torsion, PID. Labs are pending, patient will rtc as needed.  Wallis BambergMario Rosser [place], PA-C [facility] [phone] 03/20/2016 8:24 AM

## 2016-03-21 ENCOUNTER — Encounter: Payer: Self-pay | Admitting: Adult Health

## 2016-03-21 ENCOUNTER — Ambulatory Visit (INDEPENDENT_AMBULATORY_CARE_PROVIDER_SITE_OTHER): Payer: 59

## 2016-03-21 ENCOUNTER — Ambulatory Visit (INDEPENDENT_AMBULATORY_CARE_PROVIDER_SITE_OTHER): Payer: 59 | Admitting: Adult Health

## 2016-03-21 VITALS — BP 142/88 | HR 82 | Ht 65.0 in | Wt 370.0 lb

## 2016-03-21 DIAGNOSIS — N854 Malposition of uterus: Secondary | ICD-10-CM | POA: Diagnosis not present

## 2016-03-21 DIAGNOSIS — T8332XA Displacement of intrauterine contraceptive device, initial encounter: Secondary | ICD-10-CM | POA: Insufficient documentation

## 2016-03-21 DIAGNOSIS — R102 Pelvic and perineal pain: Secondary | ICD-10-CM

## 2016-03-21 LAB — GC/CHLAMYDIA PROBE AMP
CT PROBE, AMP APTIMA: NOT DETECTED
GC PROBE AMP APTIMA: NOT DETECTED

## 2016-03-21 LAB — RPR

## 2016-03-21 NOTE — Progress Notes (Signed)
Subjective:     Patient ID: Beatris ShipStephanie L Villacis, female   DOB: 08/07/1985, 30 y.o.   MRN: [id number]  HPI [name] is a 30 year old white female, in for US to assess IUD.She had some spotting and was [facility] and they could not see her strings.She had urine, wet prep and GC/CHL and UPT done there.   Review of Systems Spotted with IUD No visible strings Reviewed past medical,surgical, social and family history. Reviewed medications and allergies.     Objective:   Physical Exam BP (!) 142/88 (BP Location: Left Arm, Patient Position: Sitting, Cuff Size: Large)   Pulse 82   Ht 5\' 5"  (1.651 m)   Wt (!) 370 lb (167.8 kg)   LMP 03/06/2016 (Exact Date)   BMI 61.57 kg/m    PHQ 9 score 5, not depressed, concerned over finances. US shows IUD in lower uterine segment,below IUD scar, can leave in place,per Dr Despina HiddenEure.She wants another IUD when this one needs removing(March 2018).GC/CHL was negative.  Assessment:     1. Malpositioned intrauterine device (IUD), initial encounter       Plan:       Return 12/7 for pap and physical and will schedule for IUD removal and reinsertion in February with Dr Emelda FearFerguson, as may need cervical block.

## 2016-03-21 NOTE — Progress Notes (Signed)
PELVIC UT TA/TV: Homogeneous anteverted uterus,wnl,EEC 3.6 mm,normal ov's bilat,IUD is located w/in the LUS at the level of the c-section scar,no free fluid, ov's appear mobile,no pain during ultrasound

## 2016-03-21 NOTE — Patient Instructions (Signed)
Return 12/7 for pap and physical

## 2016-04-10 ENCOUNTER — Ambulatory Visit (INDEPENDENT_AMBULATORY_CARE_PROVIDER_SITE_OTHER): Payer: 59 | Admitting: Adult Health

## 2016-04-10 ENCOUNTER — Encounter: Payer: Self-pay | Admitting: Adult Health

## 2016-04-10 ENCOUNTER — Other Ambulatory Visit (HOSPITAL_COMMUNITY)
Admission: RE | Admit: 2016-04-10 | Discharge: 2016-04-10 | Disposition: A | Discharge status: Home / Self Care | Payer: 59 | Source: Ambulatory Visit | Attending: Adult Health | Admitting: Adult Health

## 2016-04-10 VITALS — BP 118/70 | HR 74 | Ht 65.0 in | Wt 376.6 lb

## 2016-04-10 DIAGNOSIS — Z01419 Encounter for gynecological examination (general) (routine) without abnormal findings: Secondary | ICD-10-CM | POA: Insufficient documentation

## 2016-04-10 DIAGNOSIS — L680 Hirsutism: Secondary | ICD-10-CM | POA: Diagnosis not present

## 2016-04-10 DIAGNOSIS — Z01411 Encounter for gynecological examination (general) (routine) with abnormal findings: Secondary | ICD-10-CM | POA: Diagnosis not present

## 2016-04-10 DIAGNOSIS — Z1151 Encounter for screening for human papillomavirus (HPV): Secondary | ICD-10-CM | POA: Insufficient documentation

## 2016-04-10 DIAGNOSIS — T8332XA Displacement of intrauterine contraceptive device, initial encounter: Secondary | ICD-10-CM

## 2016-04-10 MED ORDER — SPIRONOLACTONE 50 MG PO TABS: 50.0000 mg | ORAL_TABLET | Freq: Every day | ORAL | 6 refills | Status: DC

## 2016-04-10 NOTE — Patient Instructions (Signed)
Return in 2 months for IUD removal and reinsertion with Dr Emelda FearFerguson Call about insurance Physical in 1 year, pap in 3 if normal

## 2016-04-10 NOTE — Progress Notes (Signed)
Patient ID: Jamie Aguilar, female   DOB: 03/11/1986, 30 y.o.   MRN: [id number] History of Present Illness: Jamie Aguilar is a 30 year old white female in for a well woman gyn exam and pap.Has increased hair growth.    Current Medications, Allergies, Past Medical History, Past Surgical History, Family History and Social History were reviewed in [facility] electronic medical record.     Review of Systems: Patient denies any headaches, hearing loss, fatigue, blurred vision, shortness of breath, chest pain, abdominal pain, problems with bowel movements, urination, or intercourse. No joint pain or mood swings.+increased hair growth     Physical Exam:BP 118/70 (BP Location: Left Arm, Patient Position: Sitting, Cuff Size: Large)   Pulse 74   Ht 5\' 5"  (1.651 m)   Wt (!) 376 lb 9.6 oz (170.8 kg)   LMP 03/06/2016 (Exact Date)   BMI 62.67 kg/m  General:  Well developed, well nourished, no acute distress Skin:  Warm and dry, increased hair growth on face,chest and breasts and stomach Neck:  Midline trachea, normal thyroid, good ROM, no lymphadenopathy Lungs; Clear to auscultation bilaterally Breast:  No dominant palpable mass, retraction, or nipple discharge Cardiovascular: Regular rate and rhythm Abdomen:  Soft, non tender, no hepatosplenomegaly Pelvic:  External genitalia is normal in appearance, no lesions.  The vagina is normal in appearance,period like blood. Urethra has no lesions or masses. The cervix is bulbous,no IUD strings seen but had US in November and it is malpositioned, pap with HPV performed .  Uterus is felt to be normal size, shape, and contour.  No adnexal masses or tenderness noted.Bladder is non tender, no masses felt. Extremities/musculoskeletal:  No swelling or varicosities noted, no clubbing or cyanosis Psych:  No mood changes, alert and cooperative,seems happy PHQ 2 score 0.Will change IUD out in February. Declines labs today.Will try aldactone for excess hair  growth.  Impression:  1. Encounter for gynecological examination with Papanicolaou smear of cervix   2. Malpositioned intrauterine device (IUD), initial encounter   3. Hirsutism      Plan:  Meds ordered this encounter  Medications  . spironolactone (ALDACTONE) 50 MG tablet    Sig: Take 1 tablet (50 mg total) by mouth daily.    Dispense:  30 tablet    Refill:  6    Order Specific Question:   Supervising Provider    Answer:   Lazaro ArmsEURE, LUTHER H [[id number]]   Return in 2 months for IUD removal and reinsertion with Dr Stevie KernFerguson(may need cervical block) Call about insurance Physical in 1 year, pap in 3 if normal

## 2016-04-11 LAB — CYTOLOGY - PAP
DIAGNOSIS: NEGATIVE
HPV: NOT DETECTED

## 2016-06-19 ENCOUNTER — Ambulatory Visit: Payer: 59 | Admitting: Obstetrics and Gynecology

## 2017-04-14 ENCOUNTER — Encounter: Payer: Self-pay | Admitting: *Deleted

## 2017-04-14 ENCOUNTER — Telehealth: Payer: Self-pay | Admitting: Obstetrics and Gynecology

## 2017-04-14 MED ORDER — MISOPROSTOL 200 MCG PO TABS: 200.0000 ug | ORAL_TABLET | Freq: Once | ORAL | 0 refills | Status: DC

## 2017-04-14 NOTE — Telephone Encounter (Signed)
patient called stating that she is having a pap and physical and an IUD removal and she states that a medication needs to be called before her procedure. Pt states she uses [facility]. Please contact pt

## 2017-04-15 ENCOUNTER — Encounter: Payer: Self-pay | Admitting: Obstetrics and Gynecology

## 2017-04-15 ENCOUNTER — Ambulatory Visit (INDEPENDENT_AMBULATORY_CARE_PROVIDER_SITE_OTHER): Payer: 59 | Admitting: Obstetrics and Gynecology

## 2017-04-15 VITALS — BP 132/80 | HR 78 | Ht 65.0 in | Wt 370.8 lb

## 2017-04-15 DIAGNOSIS — Z01419 Encounter for gynecological examination (general) (routine) without abnormal findings: Secondary | ICD-10-CM | POA: Diagnosis not present

## 2017-04-15 DIAGNOSIS — Z30432 Encounter for removal of intrauterine contraceptive device: Secondary | ICD-10-CM

## 2017-04-15 NOTE — Progress Notes (Addendum)
Jamie Aguilar is a 31 y.o. G1P1001 here for IUD removal. No GYN concerns.  Last pap smear was normal. She wants another IUD placed after a normal menstrual cycle. She took a Cytotec this morning and had some cramping. She had a period every month with her IUD.  She had a C-section in 2012. She experiences depression, and describes her self under a lot of stress, attempting to be a single parent to her child.  Should this is the first Christmas she is separated from her long-term partner has developed another relationship and moved on.  Her family is described as very stoic and she has difficulty figuring how to to receive support from them.  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  A speculum was placed in the patient's vagina, normal discharge was noted, no lesions. The multiparous cervix was visualized, no lesions, no abnormal discharge;  and the cervix was swabbed with Betadine using scopettes. The strings of the IUD were grasped and pulled using ring forceps.  The IUD was successfully removed in its entirety.  We spent 20 minutes discussing strategies for managing her problems.  She seem to be interested in a solution that would involve getting a dry erase board, sticky notes and develop a list of problem projects and goals so that she can maintain a list of successes and projects to be worked on we will discuss this further at her follow-up visit. Future contraception: IUD reinsertion F/u in 3-4 months for IUD insertion and U/S for placement check  20 minutes of discussion over strategies for managing By signing my name below, I, Izna Ahmed, attest that this documentation has been prepared under the direction and in the presence of Tilda BurrowFerguson, Haseeb Fiallos V,  MD. Electronically Signed: Redge GainerIzna Ahmed, Medical Scribe. 04/15/17. 10:30 AM.  I personally performed the services described in this documentation, which was SCRIBED in my presence. The recorded information has been reviewed and considered accurate. It has been edited as necessary during review. Tilda BurrowJohn V Nivia Gervase, MD

## 2017-05-15 ENCOUNTER — Telehealth: Payer: 59 | Admitting: Family

## 2017-05-15 DIAGNOSIS — J029 Acute pharyngitis, unspecified: Secondary | ICD-10-CM

## 2017-05-15 MED ORDER — BENZONATATE 100 MG PO CAPS: 100.0000 mg | ORAL_CAPSULE | Freq: Three times a day (TID) | ORAL | 0 refills | Status: DC | PRN

## 2017-05-15 MED ORDER — PREDNISONE 5 MG PO TABS: 5.0000 mg | ORAL_TABLET | ORAL | 0 refills | Status: DC

## 2017-05-15 NOTE — Progress Notes (Signed)
Thank you for the details you included in the comment boxes. Those details are very helpful in determining the best course of treatment for you and help us to provide the best care.  We are sorry that you are not feeling well.  Here is how we plan to help!  Based on your presentation I believe you most likely have A cough due to a virus.  This is called viral bronchitis and is best treated by rest, plenty of fluids and control of the cough.  You may use Ibuprofen or Tylenol as directed to help your symptoms.     In addition you may use A non-prescription cough medication called Mucinex DM: take 2 tablets every 12 hours. and A prescription cough medication called Tessalon Perles 100mg. You may take 1-2 capsules every 8 hours as needed for your cough.  Sterapred 5 mg dosepak  From your responses in the eVisit questionnaire you describe inflammation in the upper respiratory tract which is causing a significant cough.  This is commonly called Bronchitis and has four common causes:    Allergies  Viral Infections  Acid Reflux  Bacterial Infection Allergies, viruses and acid reflux are treated by controlling symptoms or eliminating the cause. An example might be a cough caused by taking certain blood pressure medications. You stop the cough by changing the medication. Another example might be a cough caused by acid reflux. Controlling the reflux helps control the cough.  USE OF BRONCHODILATOR ("RESCUE") INHALERS: There is a risk from using your bronchodilator too frequently.  The risk is that over-reliance on a medication which only relaxes the muscles surrounding the breathing tubes can reduce the effectiveness of medications prescribed to reduce swelling and congestion of the tubes themselves.  Although you feel brief relief from the bronchodilator inhaler, your asthma may actually be worsening with the tubes becoming more swollen and filled with mucus.  This can delay other crucial treatments, such  as oral steroid medications. If you need to use a bronchodilator inhaler daily, several times per day, you should discuss this with your provider.  There are probably better treatments that could be used to keep your asthma under control.     HOME CARE . Only take medications as instructed by your medical team. . Complete the entire course of an antibiotic. . Drink plenty of fluids and get plenty of rest. . Avoid close contacts especially the very young and the elderly . Cover your mouth if you cough or cough into your sleeve. . Always remember to wash your hands . A steam or ultrasonic humidifier can help congestion.   GET HELP RIGHT AWAY IF: . You develop worsening fever. . You become short of breath . You cough up blood. . Your symptoms persist after you have completed your treatment plan MAKE SURE YOU   Understand these instructions.  Will watch your condition.  Will get help right away if you are not doing well or get worse.  Your e-visit answers were reviewed by a board certified advanced clinical practitioner to complete your personal care plan.  Depending on the condition, your plan could have included both over the counter or prescription medications. If there is a problem please reply  once you have received a response from your provider. Your safety is important to us.  If you have drug allergies check your prescription carefully.    You can use MyChart to ask questions about today's visit, request a non-urgent call back, or ask for a work   or school excuse for 24 hours related to this e-Visit. If it has been greater than 24 hours you will need to follow up with your provider, or enter a new e-Visit to address those concerns. You will get an e-mail in the next two days asking about your experience.  I hope that your e-visit has been valuable and will speed your recovery. Thank you for using e-visits.   

## 2017-08-20 ENCOUNTER — Encounter: Payer: Self-pay | Admitting: Women's Health

## 2017-08-20 ENCOUNTER — Ambulatory Visit: Payer: 59 | Admitting: Women's Health

## 2017-08-20 VITALS — BP 128/72 | HR 80 | Ht 64.0 in | Wt 377.2 lb

## 2017-08-20 DIAGNOSIS — Z3202 Encounter for pregnancy test, result negative: Secondary | ICD-10-CM | POA: Diagnosis not present

## 2017-08-20 DIAGNOSIS — Z30430 Encounter for insertion of intrauterine contraceptive device: Secondary | ICD-10-CM | POA: Insufficient documentation

## 2017-08-20 LAB — POCT URINE PREGNANCY: Preg Test, Ur: NEGATIVE

## 2017-08-20 MED ORDER — LEVONORGESTREL 19.5 MCG/DAY IU IUD
INTRAUTERINE_SYSTEM | Freq: Once | INTRAUTERINE | Status: AC
  Administered 2017-08-20: 1 via INTRAUTERINE

## 2017-08-20 NOTE — Patient Instructions (Signed)
 Nothing in vagina for 3 days (no sex, douching, tampons, etc...)  Check your strings once a month to make sure you can feel them, if you are not able to please let us know  If you develop a fever of 100.4 or more in the next few weeks, or if you develop severe abdominal pain, please let us know  Use a backup method of birth control, such as condoms, for 2 weeks    Intrauterine Device Insertion, Care After This sheet gives you information about how to care for yourself after your procedure. Your health care provider may also give you more specific instructions. If you have problems or questions, contact your health care provider. What can I expect after the procedure? After the procedure, it is common to have:  Cramps and pain in the abdomen.  Light bleeding (spotting) or heavier bleeding that is like your menstrual period. This may last for up to a few days.  Lower back pain.  Dizziness.  Headaches.  Nausea.  Follow these instructions at home:  Before resuming sexual activity, check to make sure that you can feel the IUD string(s). You should be able to feel the end of the string(s) below the opening of your cervix. If your IUD string is in place, you may resume sexual activity. ? If you had a hormonal IUD inserted more than 7 days after your most recent period started, you will need to use a backup method of birth control for 7 days after IUD insertion. Ask your health care provider whether this applies to you.  Continue to check that the IUD is still in place by feeling for the string(s) after every menstrual period, or once a month.  Take over-the-counter and prescription medicines only as told by your health care provider.  Do not drive or use heavy machinery while taking prescription pain medicine.  Keep all follow-up visits as told by your health care provider. This is important. Contact a health care provider if:  You have bleeding that is heavier or lasts longer than  a normal menstrual cycle.  You have a fever.  You have cramps or abdominal pain that get worse or do not get better with medicine.  You develop abdominal pain that is new or is not in the same area of earlier cramping and pain.  You feel lightheaded or weak.  You have abnormal or bad-smelling discharge from your vagina.  You have pain during sexual activity.  You have any of the following problems with your IUD string(s): ? The string bothers or hurts you or your sexual partner. ? You cannot feel the string. ? The string has gotten longer.  You can feel the IUD in your vagina.  You think you may be pregnant, or you miss your menstrual period.  You think you may have an STI (sexually transmitted infection). Get help right away if:  You have flu-like symptoms.  You have a fever and chills.  You can feel that your IUD has slipped out of place. Summary  After the procedure, it is common to have cramps and pain in the abdomen. It is also common to have light bleeding (spotting) or heavier bleeding that is like your menstrual period.  Continue to check that the IUD is still in place by feeling for the string(s) after every menstrual period, or once a month.  Keep all follow-up visits as told by your health care provider. This is important.  Contact your health care provider if   you have problems with your IUD string(s), such as the string getting longer or bothering you or your sexual partner. This information is not intended to replace advice given to you by your health care provider. Make sure you discuss any questions you have with your health care provider. Document Released: 12/18/2010 Document Revised: 03/12/2016 Document Reviewed: 03/12/2016 Elsevier Interactive Patient Education  2017 Elsevier [employer]  Levonorgestrel intrauterine device (IUD) What is this medicine? LEVONORGESTREL IUD (LEE voe nor jes trel) is a contraceptive (birth control) device. The device is placed  inside the uterus by a healthcare professional. It is used to prevent pregnancy. This device can also be used to treat heavy bleeding that occurs during your period. This medicine may be used for other purposes; ask your health care provider or pharmacist if you have questions. COMMON BRAND NAME(S): Kyleena, LILETTA, Mirena, Skyla What should I tell my health care provider before I take this medicine? They need to know if you have any of these conditions: -abnormal Pap smear -cancer of the breast, uterus, or cervix -diabetes -endometritis -genital or pelvic infection now or in the past -have more than one sexual partner or your partner has more than one partner -heart disease -history of an ectopic or tubal pregnancy -immune system problems -IUD in place -liver disease or tumor -problems with blood clots or take blood-thinners -seizures -use intravenous drugs -uterus of unusual shape -vaginal bleeding that has not been explained -an unusual or allergic reaction to levonorgestrel, other hormones, silicone, or polyethylene, medicines, foods, dyes, or preservatives -pregnant or trying to get pregnant -breast-feeding How should I use this medicine? This device is placed inside the uterus by a health care professional. Talk to your pediatrician regarding the use of this medicine in children. Special care may be needed. Overdosage: If you think you have taken too much of this medicine contact a poison control center or emergency room at once. NOTE: This medicine is only for you. Do not share this medicine with others. What if I miss a dose? This does not apply. Depending on the brand of device you have inserted, the device will need to be replaced every 3 to 5 years if you wish to continue using this type of birth control. What may interact with this medicine? Do not take this medicine with any of the following medications: -amprenavir -bosentan -fosamprenavir This medicine may also  interact with the following medications: -aprepitant -armodafinil -barbiturate medicines for inducing sleep or treating seizures -bexarotene -boceprevir -griseofulvin -medicines to treat seizures like carbamazepine, ethotoin, felbamate, oxcarbazepine, phenytoin, topiramate -modafinil -pioglitazone -rifabutin -rifampin -rifapentine -some medicines to treat HIV infection like atazanavir, efavirenz, indinavir, lopinavir, nelfinavir, tipranavir, ritonavir -St. John's wort -warfarin This list may not describe all possible interactions. Give your health care provider a list of all the medicines, herbs, non-prescription drugs, or dietary supplements you use. Also tell them if you smoke, drink alcohol, or use illegal drugs. Some items may interact with your medicine. What should I watch for while using this medicine? Visit your doctor or health care professional for regular check ups. See your doctor if you or your partner has sexual contact with others, becomes HIV positive, or gets a sexual transmitted disease. This product does not protect you against HIV infection (AIDS) or other sexually transmitted diseases. You can check the placement of the IUD yourself by reaching up to the top of your vagina with clean fingers to feel the threads. Do not pull on the threads. It is a good habit   to check placement after each menstrual period. Call your doctor right away if you feel more of the IUD than just the threads or if you cannot feel the threads at all. The IUD may come out by itself. You may become pregnant if the device comes out. If you notice that the IUD has come out use a backup birth control method like condoms and call your health care provider. Using tampons will not change the position of the IUD and are okay to use during your period. This IUD can be safely scanned with magnetic resonance imaging (MRI) only under specific conditions. Before you have an MRI, tell your healthcare provider that  you have an IUD in place, and which type of IUD you have in place. What side effects may I notice from receiving this medicine? Side effects that you should report to your doctor or health care professional as soon as possible: -allergic reactions like skin rash, itching or hives, swelling of the face, lips, or tongue -fever, flu-like symptoms -genital sores -high blood pressure -no menstrual period for 6 weeks during use -pain, swelling, warmth in the leg -pelvic pain or tenderness -severe or sudden headache -signs of pregnancy -stomach cramping -sudden shortness of breath -trouble with balance, talking, or walking -unusual vaginal bleeding, discharge -yellowing of the eyes or skin Side effects that usually do not require medical attention (report to your doctor or health care professional if they continue or are bothersome): -acne -breast pain -change in sex drive or performance -changes in weight -cramping, dizziness, or faintness while the device is being inserted -headache -irregular menstrual bleeding within first 3 to 6 months of use -nausea This list may not describe all possible side effects. Call your doctor for medical advice about side effects. You may report side effects to FDA at 1-800-FDA-1088. Where should I keep my medicine? This does not apply. NOTE: This sheet is a summary. It may not cover all possible information. If you have questions about this medicine, talk to your doctor, pharmacist, or health care provider.  2018 Elsevier/Gold Standard (2016-02-01 14:14:56)   

## 2017-08-20 NOTE — Progress Notes (Signed)
   IUD INSERTION Patient name: Jamie Aguilar MRN [id number]  Date of birth: 08/23/1985 Subjective Findings:   Jamie Aguilar is a 32 y.o. 701P1001 Caucasian female being seen today for insertion of a Liletta IUD.   Patient's last menstrual period was 08/04/2017. Last sexual intercourse was 08/06/17 Last pap12/7/17. Results were:  normal  The risks and benefits of the method and placement have been thouroughly reviewed with the patient and all questions were answered.  Specifically the patient is aware of failure rate of 05/998, expulsion of the IUD and of possible perforation.  The patient is aware of irregular bleeding due to the method and understands the incidence of irregular bleeding diminishes with time.  Signed copy of informed consent in chart.  Pertinent History Reviewed:   Reviewed past medical,surgical, social, obstetrical and family history.  Reviewed problem list, medications and allergies. Objective Findings & Procedure:   Vitals:   08/20/17 1026  BP: 128/72  Pulse: 80  Weight: (!) 377 lb 3.2 oz (171.1 kg)  Height: 5\' 4"  (1.626 m)  Body mass index is 64.75 kg/m.  Results for orders placed or performed in visit on 08/20/17 (from the past 24 hour(s))  POCT urine pregnancy   Collection Time: 08/20/17 10:31 AM  Result Value Ref Range   Preg Test, Ur Negative Negative     Time out was performed.  A graves speculum was placed in the vagina.  The cervix was visualized, prepped using Betadine, and grasped with a single tooth tenaculum. The uterus was found to be neutral and it sounded to 9 cm.  Liletta IUD placed per manufacturer's recommendations. The strings were trimmed to approximately 3 cm. The patient tolerated the procedure well.   Informal transvaginal sonogram was performed and the proper placement of the IUD was verified. Assessment & Plan:   1) Liletta IUD insertion The patient was given post procedure instructions, including signs and symptoms of infection  and to check for the strings after each menses or each month, and refraining from intercourse or anything in the vagina for 3 days. She was given a Liletta care card with date IUD placed, and date IUD to be removed. She is scheduled for a f/u appointment in 4 weeks.  Orders Placed This Encounter  Procedures  . POCT urine pregnancy    Return in about 1 month (around 09/17/2017) for F/U.  Cheral MarkerKimberly R Tabbitha Janvrin CNM, [facility] 08/20/2017 11:11 AM

## 2017-09-17 ENCOUNTER — Ambulatory Visit: Payer: 59 | Admitting: Women's Health

## 2019-06-22 ENCOUNTER — Other Ambulatory Visit: Payer: Self-pay

## 2019-06-22 ENCOUNTER — Other Ambulatory Visit (HOSPITAL_COMMUNITY)
Admission: RE | Admit: 2019-06-22 | Discharge: 2019-06-22 | Disposition: A | Discharge status: Home / Self Care | Payer: No Typology Code available for payment source | Source: Ambulatory Visit | Attending: Obstetrics & Gynecology | Admitting: Obstetrics & Gynecology

## 2019-06-22 ENCOUNTER — Other Ambulatory Visit: Payer: No Typology Code available for payment source

## 2019-06-22 VITALS — Ht 65.0 in | Wt 364.0 lb

## 2019-06-22 DIAGNOSIS — Z113 Encounter for screening for infections with a predominantly sexual mode of transmission: Secondary | ICD-10-CM | POA: Diagnosis present

## 2019-06-22 NOTE — Progress Notes (Signed)
   NURSE VISIT- VAGINITIS/STD/POC  SUBJECTIVE:  Jamie Aguilar is a 34 y.o. G1P1001 GYN patientfemale here for a vaginal swab for STD screen.  She reports  No symptoms  OBJECTIVE:  There were no vitals taken for this visit.  Appears well, in no apparent distress  ASSESSMENT: Vaginal swab for STD  PLAN: Self-collected vaginal probe for std. sent to lab Treatment: to be determined once results are received Follow-up as needed if symptoms persist/worsen, or new symptoms develop  Rennis Petty  06/22/2019 2:39 PM

## 2019-06-27 LAB — CERVICOVAGINAL ANCILLARY ONLY
Chlamydia: NEGATIVE
Comment: NEGATIVE
Comment: NORMAL
Neisseria Gonorrhea: NEGATIVE

## 2019-07-20 ENCOUNTER — Other Ambulatory Visit (HOSPITAL_COMMUNITY)
Admission: RE | Admit: 2019-07-20 | Discharge: 2019-07-20 | Disposition: A | Discharge status: Home / Self Care | Payer: No Typology Code available for payment source | Source: Ambulatory Visit | Attending: Obstetrics & Gynecology | Admitting: Obstetrics & Gynecology

## 2019-07-20 ENCOUNTER — Ambulatory Visit (INDEPENDENT_AMBULATORY_CARE_PROVIDER_SITE_OTHER): Payer: No Typology Code available for payment source | Admitting: Women's Health

## 2019-07-20 ENCOUNTER — Other Ambulatory Visit: Payer: Self-pay

## 2019-07-20 ENCOUNTER — Encounter: Payer: Self-pay | Admitting: Women's Health

## 2019-07-20 VITALS — BP 114/77 | HR 83 | Ht 64.0 in | Wt 362.6 lb

## 2019-07-20 DIAGNOSIS — Z01419 Encounter for gynecological examination (general) (routine) without abnormal findings: Secondary | ICD-10-CM

## 2019-07-20 DIAGNOSIS — L680 Hirsutism: Secondary | ICD-10-CM

## 2019-07-20 DIAGNOSIS — A630 Anogenital (venereal) warts: Secondary | ICD-10-CM | POA: Diagnosis not present

## 2019-07-20 MED ORDER — TRICHLOROACETIC ACID 80 % EX LIQD
CUTANEOUS | 0 refills | Status: DC
Start: 2019-07-20 — End: 2023-07-09

## 2019-07-20 NOTE — Progress Notes (Signed)
WELL-WOMAN EXAMINATION Patient name: Jamie Aguilar MRN [id number]  Date of birth: [birth year] Chief Complaint:   Gynecologic Exam  History of Present Illness:   Jamie Aguilar is a 34 y.o. G80P1001 Caucasian female being seen today for a routine well-woman exam.  Current complaints: wart on inner Lt labia, has been there for awhile, doesn't really bother her. Had TCA tx years ago for perianal warts.  Has to shave daily for chin/neck hair growth, was on spirolactone in past, didn't really help  PCP: Jamie Aguilar      does not desire labs, done w/ PCP Patient's last menstrual period was 07/10/2019 (exact date). The current method of family planning is IUD. Liletta placed 08/20/17 Last pap 04/10/16. Results were: normal. H/O abnormal pap: No Last mammogram: never. Results were: n/a. Family h/o breast cancer: No Last colonoscopy: never. Results were: n/a. Family h/o colorectal cancer: No Review of Systems:   Pertinent items are noted in HPI Denies any headaches, blurred vision, fatigue, shortness of breath, chest pain, abdominal pain, abnormal vaginal discharge/itching/odor/irritation, problems with periods, bowel movements, urination, or intercourse unless otherwise stated above. Pertinent History Reviewed:  Reviewed past medical,surgical, social and family history.  Reviewed problem list, medications and allergies. Physical Assessment:   Vitals:   07/20/19 1339  BP: 114/77  Pulse: 83  Weight: (!) 362 lb 9.6 oz (164.5 kg)  Height: 5\' 4"  (1.626 m)  Body mass index is 62.24 kg/m.        Physical Examination:   General appearance - well appearing, and in no distress  Mental status - alert, oriented to person, place, and time  Psych:  She has a normal mood and affect  Skin - warm and dry, normal color, no suspicious lesions noted, + excessive hair neck/chin  Chest - effort normal, all lung fields clear to auscultation bilaterally  Heart - normal rate and regular rhythm  Neck:   midline trachea, no thyromegaly or nodules  Breasts - breasts appear normal, no suspicious masses, no skin or nipple changes or  axillary nodes  Abdomen - soft, nontender, nondistended, no masses or organomegaly  Pelvic - VULVA: normal appearing vulva with no masses, tenderness. + condyloma Lt labia and towards buttocks. VAGINA: normal appearing vagina with normal color and discharge, no lesions  CERVIX: normal appearing cervix without discharge or lesions, no CMT, IUD strings visible  Thin prep pap is done w/ HR HPV cotesting  UTERUS: uterus is felt to be normal size, shape, consistency and nontender   ADNEXA: No adnexal masses or tenderness noted.  Extremities:  No swelling or varicosities noted  Chaperone: Jamie Aguilar    No results found for this or any previous visit (from the past 24 hour(s)).  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   Meds:  Meds ordered this encounter  Medications  . trichloroacetic acid 80 % LIQD    Sig: Dispense for in office use    Dispense:  15 mL    Refill:  0    Order Specific Question:   Supervising Provider    Answer:   Florian Buff [[id number]]    Follow-up: Return  for 1st available, with me for TCA treatment.  Cheral Marker CNM, [facility] 07/20/2019 2:35 PM

## 2019-07-22 ENCOUNTER — Ambulatory Visit: Payer: No Typology Code available for payment source | Admitting: Women's Health

## 2019-07-22 LAB — CYTOLOGY - PAP
Comment: NEGATIVE
Diagnosis: NEGATIVE
High risk HPV: NEGATIVE

## 2019-07-25 ENCOUNTER — Other Ambulatory Visit: Payer: Self-pay

## 2019-07-25 ENCOUNTER — Ambulatory Visit (INDEPENDENT_AMBULATORY_CARE_PROVIDER_SITE_OTHER): Payer: No Typology Code available for payment source | Admitting: Women's Health

## 2019-07-25 ENCOUNTER — Encounter: Payer: Self-pay | Admitting: Women's Health

## 2019-07-25 VITALS — BP 127/81 | HR 88 | Ht 64.0 in | Wt 360.4 lb

## 2019-07-25 DIAGNOSIS — A630 Anogenital (venereal) warts: Secondary | ICD-10-CM | POA: Insufficient documentation

## 2019-07-25 DIAGNOSIS — A5131 Condyloma latum: Secondary | ICD-10-CM

## 2019-07-25 NOTE — Progress Notes (Signed)
   GYN VISIT Patient name: Jamie Aguilar MRN [id number]  Date of birth: 12/08/1985 Chief Complaint:   Follow-up (tca treatment)  History of Present Illness:   Jamie Aguilar is a 34 y.o. G43P1001 Caucasian female being seen today for 1st TCA tx for 2 vulvar condyloma.     Patient's last menstrual period was 07/10/2019 (exact date). The current method of family planning is IUD.  Last pap 07/20/19. Results were:  normal Review of Systems:   Pertinent items are noted in HPI Denies fever/chills, dizziness, headaches, visual disturbances, fatigue, shortness of breath, chest pain, abdominal pain, vomiting, abnormal vaginal discharge/itching/odor/irritation, problems with periods, bowel movements, urination, or intercourse unless otherwise stated above.  Pertinent History Reviewed:  Reviewed past medical,surgical, social, obstetrical and family history.  Reviewed problem list, medications and allergies. Physical Assessment:   Vitals:   07/25/19 1003  BP: 127/81  Pulse: 88  Weight: (!) 360 lb 6.4 oz (163.5 kg)  Height: 5\' 4"  (1.626 m)  Body mass index is 61.86 kg/m.       Physical Examination:   General appearance: alert, well appearing, and in no distress  Mental status: alert, oriented to person, place, and time  Skin: warm & dry   Cardiovascular: normal heart rate noted  Respiratory: normal respiratory effort, no distress  Abdomen: soft, non-tender   Pelvic: Vulva: 1 condyloma Lt Labia majora towards buttocks and 1 perianal, tx w/ TCA  Extremities: no edema   Chaperone: Amanda Rash    No results found for this or any previous visit (from the past 24 hour(s)).  Assessment & Plan:  1) Genital condyloma> 1st TCA tx, return in 2wks  Meds: No orders of the defined types were placed in this encounter.   No orders of the defined types were placed in this encounter.   Return in about 2 weeks (around 08/08/2019) for TCA treatment, with me.  10/08/2019 CNM,  [facility] 07/25/2019 10:19 AM

## 2019-08-18 ENCOUNTER — Ambulatory Visit: Payer: No Typology Code available for payment source | Admitting: Women's Health

## 2022-04-18 ENCOUNTER — Other Ambulatory Visit (HOSPITAL_COMMUNITY): Payer: Self-pay

## 2022-04-18 MED ORDER — ZEPBOUND 2.5 MG/0.5ML ~~LOC~~ SOAJ
2.5000 mg | SUBCUTANEOUS | 2 refills | Status: DC
  Filled 2022-04-18: qty 2, 28d supply, fill #0

## 2022-04-21 ENCOUNTER — Other Ambulatory Visit (HOSPITAL_COMMUNITY): Payer: Self-pay

## 2022-04-25 ENCOUNTER — Other Ambulatory Visit (HOSPITAL_COMMUNITY): Payer: Self-pay

## 2022-04-30 ENCOUNTER — Other Ambulatory Visit (HOSPITAL_COMMUNITY): Payer: Self-pay

## 2022-05-12 ENCOUNTER — Other Ambulatory Visit (HOSPITAL_COMMUNITY): Payer: Self-pay

## 2022-05-12 ENCOUNTER — Other Ambulatory Visit: Payer: Self-pay

## 2022-05-13 ENCOUNTER — Other Ambulatory Visit (HOSPITAL_COMMUNITY): Payer: Self-pay

## 2022-06-12 ENCOUNTER — Other Ambulatory Visit (HOSPITAL_COMMUNITY): Payer: Self-pay

## 2022-06-12 MED ORDER — ZEPBOUND 5 MG/0.5ML ~~LOC~~ SOAJ
5.0000 mg | SUBCUTANEOUS | 3 refills | Status: DC
  Filled 2022-06-12: qty 2, 28d supply, fill #0
  Filled 2022-07-11: qty 2, 28d supply, fill #1

## 2022-06-13 ENCOUNTER — Other Ambulatory Visit (HOSPITAL_COMMUNITY): Payer: Self-pay

## 2022-06-17 ENCOUNTER — Other Ambulatory Visit: Payer: Self-pay

## 2022-07-21 ENCOUNTER — Other Ambulatory Visit (HOSPITAL_COMMUNITY): Payer: Self-pay

## 2023-07-09 ENCOUNTER — Encounter: Payer: Self-pay | Admitting: Women's Health

## 2023-07-09 ENCOUNTER — Ambulatory Visit: Payer: Commercial Managed Care - PPO | Admitting: Women's Health

## 2023-07-09 ENCOUNTER — Other Ambulatory Visit (HOSPITAL_COMMUNITY)
Admission: RE | Admit: 2023-07-09 | Discharge: 2023-07-09 | Disposition: A | Discharge status: Home / Self Care | Source: Ambulatory Visit | Attending: Women's Health | Admitting: Women's Health

## 2023-07-09 VITALS — BP 145/91 | HR 88 | Ht 64.0 in | Wt 307.0 lb

## 2023-07-09 DIAGNOSIS — Z01419 Encounter for gynecological examination (general) (routine) without abnormal findings: Secondary | ICD-10-CM | POA: Insufficient documentation

## 2023-07-09 DIAGNOSIS — R030 Elevated blood-pressure reading, without diagnosis of hypertension: Secondary | ICD-10-CM

## 2023-07-09 NOTE — Progress Notes (Signed)
 WELL-WOMAN EXAMINATION Patient name: Jamie Aguilar MRN [id number]  Date of birth: 16-Sep-1985 Chief Complaint:   Annual Exam  History of Present Illness:   Jamie Aguilar is a 38 y.o. G71P1001 Caucasian female being seen today for a routine well-woman exam.  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 The current method of family planning is IUD. Liletta inserted 08/20/17 Last pap 07/20/19. Results were: NILM w/ HRHPV negative. H/O abnormal pap: no Last mammogram: never. Results were: N/A. Family h/o breast cancer: no Last colonoscopy: never. Results were: N/A. Family h/o colorectal cancer: no     07/09/2023    8:49 AM 07/20/2019    1:42 PM 04/10/2016    8:46 AM 03/21/2016    1:23 PM 03/21/2016    1:22 PM  Depression screen PHQ 2/9  Decreased Interest 0 0 0 1 1  Down, Depressed, Hopeless 1 0 0 1 1  PHQ - 2 Score 1 0 0 2 2  Altered sleeping 1   1   Tired, decreased energy 1   1   Change in appetite 0   1   Feeling bad or failure about yourself  0   0   Trouble concentrating 0   0   Moving slowly or fidgety/restless 0   0   Suicidal thoughts 0   0   PHQ-9 Score 3   5         07/09/2023    8:49 AM  GAD 7 : Generalized Anxiety Score  Nervous, Anxious, on Edge 0  Control/stop worrying 0  Worry too much - different things 0  Trouble relaxing 0  Restless 0  Easily annoyed or irritable 1  Afraid - awful might happen 0  Total GAD 7 Score 1     Review of Systems:   Pertinent items are noted in HPI Denies any headaches, blurred vision, fatigue, shortness of breath, chest pain, abdominal pain, abnormal vaginal discharge/itching/odor/irritation, problems with periods, bowel movements, urination, or intercourse unless otherwise stated above. Pertinent History Reviewed:  Reviewed past  medical,surgical, social and family history.  Reviewed problem list, medications and allergies. Physical Assessment:   Vitals:   07/09/23 0846 07/09/23 0914  BP: (!) 145/79 (!) 145/91  Pulse: 88   Weight: (!) 307 lb (139.3 kg)   Height: 5\' 4"  (1.626 m)   Body mass index is 52.7 kg/m.        Physical Examination:   General appearance - well appearing, and in no distress  Mental status - alert, oriented to person, place, and time  Psych:  She has a normal mood and affect  Skin - warm and dry, normal color, no suspicious lesions noted  Chest - effort normal, all lung fields clear to auscultation bilaterally  Heart - normal rate and regular rhythm  Neck:  midline trachea, no thyromegaly or nodules  Breasts - breasts appear normal, no suspicious masses, no skin or nipple changes or  axillary nodes  Abdomen - soft, nontender, nondistended, no masses or organomegaly  Pelvic - VULVA: normal appearing vulva with no masses, tenderness or lesions  VAGINA: normal appearing vagina with normal color and discharge, no lesions  CERVIX: normal appearing cervix without discharge or lesions, no CMT, IUD strings visible, appropriate length  Thin prep pap is done  w/ HR HPV cotesting  UTERUS: uterus is felt to be normal size, shape, consistency and nontender   ADNEXA: No adnexal masses or tenderness noted.  Extremities:  No swelling or varicosities noted  Chaperone: Latisha Cresenzo  No results found for this or any previous visit (from the past 24 hours).  Assessment & Plan:  1) Well-Woman Exam  2) Elevated bp> will let PCP know  Labs/procedures today: pap w/ STD screen  Mammogram: @ 40yo, or sooner if problems Colonoscopy: @ 39yo, or sooner if problems  No orders of the defined types were placed in this encounter.   Meds: No orders of the defined types were placed in this encounter.   Follow-up: Return in about 1 year (around 07/08/2024) for Physical.  Cheral Marker CNM,  [facility] 07/09/2023 9:14 AM

## 2023-07-09 NOTE — Addendum Note (Signed)
 Addended by: Moss Mc on: 07/09/2023 09:50 AM   Modules accepted: Orders

## 2023-07-10 ENCOUNTER — Other Ambulatory Visit (HOSPITAL_COMMUNITY): Payer: Self-pay

## 2023-07-10 MED ORDER — CITALOPRAM HYDROBROMIDE 20 MG PO TABS
20.0000 mg | ORAL_TABLET | Freq: Every day | ORAL | 0 refills | Status: AC
  Filled 2023-07-10 (×3): qty 30, 30d supply, fill #0

## 2023-07-14 LAB — CYTOLOGY - PAP
Chlamydia: NEGATIVE
Comment: NEGATIVE
Comment: NEGATIVE
Comment: NEGATIVE
Comment: NORMAL
High risk HPV: NEGATIVE
Neisseria Gonorrhea: NEGATIVE
Trichomonas: NEGATIVE

## 2023-07-15 ENCOUNTER — Encounter: Payer: Self-pay | Admitting: Women's Health

## 2023-07-15 DIAGNOSIS — R87619 Unspecified abnormal cytological findings in specimens from cervix uteri: Secondary | ICD-10-CM | POA: Insufficient documentation

## 2023-07-15 MED ORDER — METRONIDAZOLE 500 MG PO TABS: 500.0000 mg | ORAL_TABLET | Freq: Two times a day (BID) | ORAL | 0 refills | Status: AC

## 2023-07-15 NOTE — Addendum Note (Signed)
 Addended by: Shawna Clamp R on: 07/15/2023 11:14 AM   Modules accepted: Orders

## 2023-08-07 DIAGNOSIS — R7309 Other abnormal glucose: Secondary | ICD-10-CM | POA: Diagnosis not present

## 2023-08-07 DIAGNOSIS — Z0000 Encounter for general adult medical examination without abnormal findings: Secondary | ICD-10-CM | POA: Diagnosis not present
